# Patient Record
Sex: Male | Born: 1995
Health system: Southern US, Community
[De-identification: ages and names within clinical notes are randomized; demographics above are authoritative.]

## PROBLEM LIST (undated history)

## (undated) DIAGNOSIS — E119 Type 2 diabetes mellitus without complications: Secondary | ICD-10-CM

## (undated) DIAGNOSIS — I10 Essential (primary) hypertension: Secondary | ICD-10-CM

## (undated) DIAGNOSIS — J45909 Unspecified asthma, uncomplicated: Secondary | ICD-10-CM

## (undated) HISTORY — DX: Essential (primary) hypertension: I10

## (undated) HISTORY — PX: ADENOIDECTOMY: SUR15

## (undated) HISTORY — DX: Type 2 diabetes mellitus without complications: E11.9

## (undated) HISTORY — PX: TONSILLECTOMY: SUR1361

---
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
## 2018-09-06 ENCOUNTER — Emergency Department (HOSPITAL_COMMUNITY)
Admission: EM | Admit: 2018-09-06 | Discharge: 2018-09-06 | Disposition: A | Discharge status: Home / Self Care | Payer: No Typology Code available for payment source | Attending: Emergency Medicine | Admitting: Emergency Medicine

## 2018-09-06 ENCOUNTER — Other Ambulatory Visit: Payer: Self-pay

## 2018-09-06 ENCOUNTER — Emergency Department (HOSPITAL_COMMUNITY): Payer: No Typology Code available for payment source

## 2018-09-06 ENCOUNTER — Encounter (HOSPITAL_COMMUNITY): Payer: Self-pay | Admitting: Emergency Medicine

## 2018-09-06 DIAGNOSIS — S20212A Contusion of left front wall of thorax, initial encounter: Secondary | ICD-10-CM | POA: Insufficient documentation

## 2018-09-06 DIAGNOSIS — Y9389 Activity, other specified: Secondary | ICD-10-CM | POA: Diagnosis not present

## 2018-09-06 DIAGNOSIS — Y92481 Parking lot as the place of occurrence of the external cause: Secondary | ICD-10-CM | POA: Insufficient documentation

## 2018-09-06 DIAGNOSIS — Y999 Unspecified external cause status: Secondary | ICD-10-CM | POA: Insufficient documentation

## 2018-09-06 DIAGNOSIS — J45909 Unspecified asthma, uncomplicated: Secondary | ICD-10-CM | POA: Insufficient documentation

## 2018-09-06 HISTORY — DX: Unspecified asthma, uncomplicated: J45.909

## 2018-09-06 MED ORDER — IBUPROFEN 600 MG PO TABS: 600.0000 mg | ORAL_TABLET | Freq: Four times a day (QID) | ORAL | 0 refills | Status: DC | PRN

## 2018-09-06 MED ORDER — CYCLOBENZAPRINE HCL 10 MG PO TABS: 10.0000 mg | ORAL_TABLET | Freq: Two times a day (BID) | ORAL | 0 refills | Status: DC | PRN

## 2018-09-06 NOTE — ED Notes (Signed)
Fast track pt, see Providers assessment  
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
## 2020-07-03 ENCOUNTER — Inpatient Hospital Stay (HOSPITAL_COMMUNITY)
Admission: EM | Admit: 2020-07-03 | Discharge: 2020-07-09 | DRG: 464 | Disposition: A | Discharge status: Home / Self Care | Payer: Self-pay | Attending: Internal Medicine | Admitting: Internal Medicine

## 2020-07-03 ENCOUNTER — Encounter (HOSPITAL_COMMUNITY): Payer: Self-pay | Admitting: *Deleted

## 2020-07-03 DIAGNOSIS — E872 Acidosis: Secondary | ICD-10-CM | POA: Diagnosis present

## 2020-07-03 DIAGNOSIS — B9561 Methicillin susceptible Staphylococcus aureus infection as the cause of diseases classified elsewhere: Secondary | ICD-10-CM | POA: Diagnosis present

## 2020-07-03 DIAGNOSIS — K760 Fatty (change of) liver, not elsewhere classified: Secondary | ICD-10-CM | POA: Diagnosis present

## 2020-07-03 DIAGNOSIS — Z20822 Contact with and (suspected) exposure to covid-19: Secondary | ICD-10-CM | POA: Diagnosis present

## 2020-07-03 DIAGNOSIS — E1165 Type 2 diabetes mellitus with hyperglycemia: Secondary | ICD-10-CM | POA: Diagnosis present

## 2020-07-03 DIAGNOSIS — E871 Hypo-osmolality and hyponatremia: Secondary | ICD-10-CM

## 2020-07-03 DIAGNOSIS — E119 Type 2 diabetes mellitus without complications: Secondary | ICD-10-CM

## 2020-07-03 DIAGNOSIS — M726 Necrotizing fasciitis: Principal | ICD-10-CM | POA: Diagnosis present

## 2020-07-03 DIAGNOSIS — Z6841 Body Mass Index (BMI) 40.0 and over, adult: Secondary | ICD-10-CM

## 2020-07-03 DIAGNOSIS — L0231 Cutaneous abscess of buttock: Secondary | ICD-10-CM | POA: Diagnosis present

## 2020-07-03 LAB — CBC WITH DIFFERENTIAL/PLATELET
Abs Immature Granulocytes: 0.09 10*3/uL — ABNORMAL HIGH (ref 0.00–0.07)
Basophils Absolute: 0 10*3/uL (ref 0.0–0.1)
Basophils Relative: 0 %
Eosinophils Absolute: 0 10*3/uL (ref 0.0–0.5)
Eosinophils Relative: 0 %
HCT: 46 % (ref 39.0–52.0)
Hemoglobin: 14.5 g/dL (ref 13.0–17.0)
Immature Granulocytes: 1 %
Lymphocytes Relative: 11 %
Lymphs Abs: 2 10*3/uL (ref 0.7–4.0)
MCH: 25.3 pg — ABNORMAL LOW (ref 26.0–34.0)
MCHC: 31.5 g/dL (ref 30.0–36.0)
MCV: 80.3 fL (ref 80.0–100.0)
Monocytes Absolute: 1.4 10*3/uL — ABNORMAL HIGH (ref 0.1–1.0)
Monocytes Relative: 8 %
Neutro Abs: 13.8 10*3/uL — ABNORMAL HIGH (ref 1.7–7.7)
Neutrophils Relative %: 80 %
Platelets: 259 10*3/uL (ref 150–400)
RBC: 5.73 MIL/uL (ref 4.22–5.81)
RDW: 13.2 % (ref 11.5–15.5)
WBC: 17.3 10*3/uL — ABNORMAL HIGH (ref 4.0–10.5)
nRBC: 0 % (ref 0.0–0.2)

## 2020-07-03 LAB — COMPREHENSIVE METABOLIC PANEL
ALT: 26 U/L (ref 0–44)
AST: 15 U/L (ref 15–41)
Albumin: 3.1 g/dL — ABNORMAL LOW (ref 3.5–5.0)
Alkaline Phosphatase: 59 U/L (ref 38–126)
Anion gap: 13 (ref 5–15)
BUN: 6 mg/dL (ref 6–20)
CO2: 21 mmol/L — ABNORMAL LOW (ref 22–32)
Calcium: 8.8 mg/dL — ABNORMAL LOW (ref 8.9–10.3)
Chloride: 96 mmol/L — ABNORMAL LOW (ref 98–111)
Creatinine, Ser: 0.91 mg/dL (ref 0.61–1.24)
GFR calc Af Amer: >60 mL/min (ref >60)
GFR calc non Af Amer: >60 mL/min (ref >60)
Glucose, Bld: 313 mg/dL — ABNORMAL HIGH (ref 70–99)
Potassium: 4.1 mmol/L (ref 3.5–5.1)
Sodium: 130 mmol/L — ABNORMAL LOW (ref 135–145)
Total Bilirubin: 0.7 mg/dL (ref 0.3–1.2)
Total Protein: 7.9 g/dL (ref 6.5–8.1)

## 2020-07-03 LAB — PROTIME-INR
INR: 1.1 (ref 0.8–1.2)
Prothrombin Time: 13.7 seconds (ref 11.4–15.2)

## 2020-07-03 LAB — SARS CORONAVIRUS 2 BY RT PCR (HOSPITAL ORDER, PERFORMED IN ~~LOC~~ HOSPITAL LAB): SARS Coronavirus 2: NEGATIVE

## 2020-07-03 LAB — LACTIC ACID, PLASMA: Lactic Acid, Venous: 1.5 mmol/L (ref 0.5–1.9)

## 2020-07-03 MED ORDER — ACETAMINOPHEN 325 MG PO TABS
650.0000 mg | ORAL_TABLET | Freq: Once | ORAL | Status: AC | PRN
  Administered 2020-07-03: 650 mg via ORAL
  Filled 2020-07-03: qty 2

## 2020-07-03 NOTE — ED Triage Notes (Signed)
To ED for eval of possible abscess to right inner buttock area. Started 2 days ago. PA evaluated in triage and appears to be draining. Appears in nad. Painful to sit if not leaning to left side.

## 2020-07-03 NOTE — ED Provider Notes (Addendum)
Patient placed in Quick Look pathway, seen and evaluated   Chief Complaint: Right buttock abscess   HPI:   Abscess to right buttocks x2 days.  States he has not had a bowel movement in 2 days.  Was noted to be febrile with a temperature 102 here in the ED.  States he had his first Covid vaccine, no abdominal pain, cough, chest pain, shortness of breath.  ROS: Redness, swelling   Physical Exam:   Gen: No distress  Neuro: Awake and Alert  Skin: Warm    Focused Exam: Left buttocks access.  Limited exam, however does not appear to be involving the anus.  Patient is overall well-appearing, nontoxic appearing.   Initiation of care has begun. The patient has been counseled on the process, plan, and necessity for staying for the completion/evaluation, and the remainder of the medical screening examination        Leone Brand 07/03/20 1852    Charlynne Pander, MD 07/03/20 [phone]

## 2020-07-04 ENCOUNTER — Inpatient Hospital Stay (HOSPITAL_COMMUNITY): Payer: Self-pay | Admitting: Certified Registered Nurse Anesthetist

## 2020-07-04 ENCOUNTER — Encounter (HOSPITAL_COMMUNITY)
Admission: EM | Disposition: A | Discharge status: Home / Self Care | Payer: Self-pay | Source: Home / Self Care | Attending: Student in an Organized Health Care Education/Training Program

## 2020-07-04 ENCOUNTER — Encounter (HOSPITAL_COMMUNITY): Payer: Self-pay | Admitting: Student in an Organized Health Care Education/Training Program

## 2020-07-04 ENCOUNTER — Emergency Department (HOSPITAL_COMMUNITY): Payer: Self-pay

## 2020-07-04 ENCOUNTER — Other Ambulatory Visit: Payer: Self-pay

## 2020-07-04 DIAGNOSIS — M726 Necrotizing fasciitis: Secondary | ICD-10-CM

## 2020-07-04 DIAGNOSIS — E871 Hypo-osmolality and hyponatremia: Secondary | ICD-10-CM

## 2020-07-04 DIAGNOSIS — E1165 Type 2 diabetes mellitus with hyperglycemia: Secondary | ICD-10-CM

## 2020-07-04 DIAGNOSIS — E119 Type 2 diabetes mellitus without complications: Secondary | ICD-10-CM

## 2020-07-04 HISTORY — PX: INCISION AND DRAINAGE PERIRECTAL ABSCESS: SHX1804

## 2020-07-04 HISTORY — DX: Necrotizing fasciitis: M72.6

## 2020-07-04 LAB — CBG MONITORING, ED: Glucose-Capillary: 276 mg/dL — ABNORMAL HIGH (ref 70–99)

## 2020-07-04 LAB — URINALYSIS, ROUTINE W REFLEX MICROSCOPIC
Bacteria, UA: NONE SEEN
Bilirubin Urine: NEGATIVE
Glucose, UA: >=500 mg/dL — AB
Ketones, ur: 80 mg/dL — AB
Nitrite: NEGATIVE
Protein, ur: 100 mg/dL — AB
Specific Gravity, Urine: 1.04 — ABNORMAL HIGH (ref 1.005–1.030)
pH: 5 (ref 5.0–8.0)

## 2020-07-04 LAB — GLUCOSE, CAPILLARY
Glucose-Capillary: 252 mg/dL — ABNORMAL HIGH (ref 70–99)
Glucose-Capillary: 251 mg/dL — ABNORMAL HIGH (ref 70–99)
Glucose-Capillary: 276 mg/dL — ABNORMAL HIGH (ref 70–99)
Glucose-Capillary: 210 mg/dL — ABNORMAL HIGH (ref 70–99)

## 2020-07-04 LAB — HEMOGLOBIN A1C
Hgb A1c MFr Bld: 13.1 % — ABNORMAL HIGH (ref 4.8–5.6)
Mean Plasma Glucose: 329.27 mg/dL

## 2020-07-04 LAB — HIV ANTIBODY (ROUTINE TESTING W REFLEX): HIV Screen 4th Generation wRfx: NONREACTIVE

## 2020-07-04 SURGERY — INCISION AND DRAINAGE, ABSCESS, PERIRECTAL
Anesthesia: General | Site: Perineum

## 2020-07-04 MED ORDER — DEXAMETHASONE SODIUM PHOSPHATE 10 MG/ML IJ SOLN
INTRAMUSCULAR | Status: AC
  Filled 2020-07-04: qty 1

## 2020-07-04 MED ORDER — ROCURONIUM BROMIDE 10 MG/ML (PF) SYRINGE
PREFILLED_SYRINGE | INTRAVENOUS | Status: DC | PRN
  Administered 2020-07-04: 50 mg via INTRAVENOUS

## 2020-07-04 MED ORDER — FENTANYL CITRATE (PF) 250 MCG/5ML IJ SOLN
INTRAMUSCULAR | Status: DC | PRN
  Administered 2020-07-04 (×2): 50 ug via INTRAVENOUS
  Administered 2020-07-04: 100 ug via INTRAVENOUS

## 2020-07-04 MED ORDER — SUCCINYLCHOLINE CHLORIDE 200 MG/10ML IV SOSY
PREFILLED_SYRINGE | INTRAVENOUS | Status: DC | PRN
  Administered 2020-07-04: 160 mg via INTRAVENOUS

## 2020-07-04 MED ORDER — ROCURONIUM BROMIDE 10 MG/ML (PF) SYRINGE
PREFILLED_SYRINGE | INTRAVENOUS | Status: AC
  Filled 2020-07-04: qty 20

## 2020-07-04 MED ORDER — CHLORHEXIDINE GLUCONATE CLOTH 2 % EX PADS
6.0000 | MEDICATED_PAD | Freq: Every day | CUTANEOUS | Status: DC
  Administered 2020-07-04 – 2020-07-07 (×4): 6 via TOPICAL

## 2020-07-04 MED ORDER — SODIUM CHLORIDE 0.9 % IV BOLUS
1000.0000 mL | Freq: Once | INTRAVENOUS | Status: AC
  Administered 2020-07-04: 1000 mL via INTRAVENOUS

## 2020-07-04 MED ORDER — MIDAZOLAM HCL 2 MG/2ML IJ SOLN
INTRAMUSCULAR | Status: DC | PRN
  Administered 2020-07-04: 2 mg via INTRAVENOUS

## 2020-07-04 MED ORDER — ONDANSETRON HCL 4 MG/2ML IJ SOLN
INTRAMUSCULAR | Status: DC | PRN
  Administered 2020-07-04: 4 mg via INTRAVENOUS

## 2020-07-04 MED ORDER — FENTANYL CITRATE (PF) 250 MCG/5ML IJ SOLN
INTRAMUSCULAR | Status: AC
  Filled 2020-07-04: qty 5

## 2020-07-04 MED ORDER — SUGAMMADEX SODIUM 200 MG/2ML IV SOLN
INTRAVENOUS | Status: DC | PRN
  Administered 2020-07-04: 320 mg via INTRAVENOUS

## 2020-07-04 MED ORDER — SUCCINYLCHOLINE CHLORIDE 200 MG/10ML IV SOSY
PREFILLED_SYRINGE | INTRAVENOUS | Status: AC
  Filled 2020-07-04: qty 10

## 2020-07-04 MED ORDER — POLYETHYLENE GLYCOL 3350 17 G PO PACK: 17.0000 g | PACK | Freq: Every day | ORAL | Status: DC | PRN

## 2020-07-04 MED ORDER — METRONIDAZOLE IN NACL 5-0.79 MG/ML-% IV SOLN
500.0000 mg | Freq: Once | INTRAVENOUS | Status: AC
  Administered 2020-07-04: 500 mg via INTRAVENOUS
  Filled 2020-07-04: qty 100

## 2020-07-04 MED ORDER — SODIUM CHLORIDE 0.9 % IV SOLN
2.0000 g | Freq: Three times a day (TID) | INTRAVENOUS | Status: DC
  Administered 2020-07-04 – 2020-07-08 (×12): 2 g via INTRAVENOUS
  Filled 2020-07-04 (×14): qty 2

## 2020-07-04 MED ORDER — LIDOCAINE 2% (20 MG/ML) 5 ML SYRINGE
INTRAMUSCULAR | Status: DC | PRN
  Administered 2020-07-04: 100 mg via INTRAVENOUS

## 2020-07-04 MED ORDER — HEPARIN SODIUM (PORCINE) 5000 UNIT/ML IJ SOLN
5000.0000 [IU] | Freq: Three times a day (TID) | INTRAMUSCULAR | Status: DC
  Administered 2020-07-05 – 2020-07-07 (×7): 5000 [IU] via SUBCUTANEOUS
  Filled 2020-07-04 (×7): qty 1

## 2020-07-04 MED ORDER — VANCOMYCIN HCL 2000 MG/400ML IV SOLN
2000.0000 mg | Freq: Once | INTRAVENOUS | Status: AC
  Administered 2020-07-04: 2000 mg via INTRAVENOUS
  Filled 2020-07-04: qty 400

## 2020-07-04 MED ORDER — VANCOMYCIN HCL 1250 MG/250ML IV SOLN
1250.0000 mg | Freq: Three times a day (TID) | INTRAVENOUS | Status: DC
  Administered 2020-07-04 – 2020-07-07 (×9): 1250 mg via INTRAVENOUS
  Filled 2020-07-04 (×10): qty 250

## 2020-07-04 MED ORDER — LACTATED RINGERS IV SOLN: INTRAVENOUS | Status: DC

## 2020-07-04 MED ORDER — MENTHOL 3 MG MT LOZG
1.0000 | LOZENGE | OROMUCOSAL | Status: DC | PRN
  Administered 2020-07-04 – 2020-07-06 (×2): 3 mg via ORAL
  Filled 2020-07-04: qty 9

## 2020-07-04 MED ORDER — ROCURONIUM BROMIDE 10 MG/ML (PF) SYRINGE
PREFILLED_SYRINGE | INTRAVENOUS | Status: AC
  Filled 2020-07-04: qty 10

## 2020-07-04 MED ORDER — 0.9 % SODIUM CHLORIDE (POUR BTL) OPTIME
TOPICAL | Status: DC | PRN
  Administered 2020-07-04: 1000 mL

## 2020-07-04 MED ORDER — HEMOSTATIC AGENTS (NO CHARGE) OPTIME
TOPICAL | Status: DC | PRN
  Administered 2020-07-04: 1 via TOPICAL

## 2020-07-04 MED ORDER — PHENYLEPHRINE 40 MCG/ML (10ML) SYRINGE FOR IV PUSH (FOR BLOOD PRESSURE SUPPORT)
PREFILLED_SYRINGE | INTRAVENOUS | Status: AC
  Filled 2020-07-04: qty 10

## 2020-07-04 MED ORDER — ONDANSETRON HCL 4 MG/2ML IJ SOLN: INTRAMUSCULAR | Status: DC | PRN

## 2020-07-04 MED ORDER — PHENYLEPHRINE 40 MCG/ML (10ML) SYRINGE FOR IV PUSH (FOR BLOOD PRESSURE SUPPORT)
PREFILLED_SYRINGE | INTRAVENOUS | Status: DC | PRN
  Administered 2020-07-04: 40 ug via INTRAVENOUS

## 2020-07-04 MED ORDER — ONDANSETRON HCL 4 MG/2ML IJ SOLN
INTRAMUSCULAR | Status: AC
  Filled 2020-07-04: qty 2

## 2020-07-04 MED ORDER — CLINDAMYCIN PHOSPHATE 600 MG/50ML IV SOLN
600.0000 mg | Freq: Once | INTRAVENOUS | Status: AC
  Administered 2020-07-04: 600 mg via INTRAVENOUS
  Filled 2020-07-04: qty 50

## 2020-07-04 MED ORDER — PROPOFOL 10 MG/ML IV BOLUS
INTRAVENOUS | Status: DC | PRN
  Administered 2020-07-04: 200 mg via INTRAVENOUS

## 2020-07-04 MED ORDER — BUPIVACAINE HCL (PF) 0.25 % IJ SOLN
INTRAMUSCULAR | Status: AC
  Filled 2020-07-04: qty 30

## 2020-07-04 MED ORDER — CHLORHEXIDINE GLUCONATE 0.12 % MT SOLN
OROMUCOSAL | Status: AC
  Administered 2020-07-04: 15 mL
  Filled 2020-07-04: qty 15

## 2020-07-04 MED ORDER — BISACODYL 5 MG PO TBEC: 5.0000 mg | DELAYED_RELEASE_TABLET | Freq: Every day | ORAL | Status: DC | PRN

## 2020-07-04 MED ORDER — EPHEDRINE 5 MG/ML INJ
INTRAVENOUS | Status: AC
  Filled 2020-07-04: qty 10

## 2020-07-04 MED ORDER — MORPHINE SULFATE (PF) 4 MG/ML IV SOLN
4.0000 mg | Freq: Once | INTRAVENOUS | Status: AC
  Administered 2020-07-04: 4 mg via INTRAVENOUS
  Filled 2020-07-04: qty 1

## 2020-07-04 MED ORDER — LIDOCAINE 2% (20 MG/ML) 5 ML SYRINGE
INTRAMUSCULAR | Status: AC
  Filled 2020-07-04: qty 10

## 2020-07-04 MED ORDER — LIDOCAINE 2% (20 MG/ML) 5 ML SYRINGE
INTRAMUSCULAR | Status: AC
  Filled 2020-07-04: qty 5

## 2020-07-04 MED ORDER — PROPOFOL 10 MG/ML IV BOLUS
INTRAVENOUS | Status: AC
  Filled 2020-07-04: qty 20

## 2020-07-04 MED ORDER — INSULIN GLARGINE 100 UNIT/ML ~~LOC~~ SOLN
20.0000 [IU] | Freq: Every day | SUBCUTANEOUS | Status: DC
  Administered 2020-07-04: 20 [IU] via SUBCUTANEOUS
  Filled 2020-07-04 (×2): qty 0.2

## 2020-07-04 MED ORDER — HYDROMORPHONE HCL 1 MG/ML IJ SOLN
0.5000 mg | INTRAMUSCULAR | Status: DC | PRN
  Administered 2020-07-06: 0.5 mg via INTRAVENOUS
  Administered 2020-07-07: 1 mg via INTRAVENOUS
  Filled 2020-07-04 (×2): qty 1

## 2020-07-04 MED ORDER — IOHEXOL 300 MG/ML  SOLN
100.0000 mL | Freq: Once | INTRAMUSCULAR | Status: AC | PRN
  Administered 2020-07-04: 100 mL via INTRAVENOUS

## 2020-07-04 MED ORDER — ACETAMINOPHEN 325 MG PO TABS
650.0000 mg | ORAL_TABLET | ORAL | Status: AC | PRN
  Administered 2020-07-05: 650 mg via ORAL
  Filled 2020-07-04: qty 2

## 2020-07-04 MED ORDER — INSULIN ASPART 100 UNIT/ML ~~LOC~~ SOLN
0.0000 [IU] | SUBCUTANEOUS | Status: DC
  Administered 2020-07-04: 8 [IU] via SUBCUTANEOUS
  Administered 2020-07-04: 5 [IU] via SUBCUTANEOUS
  Administered 2020-07-04: 8 [IU] via SUBCUTANEOUS
  Administered 2020-07-05 (×4): 5 [IU] via SUBCUTANEOUS
  Administered 2020-07-05 – 2020-07-06 (×6): 3 [IU] via SUBCUTANEOUS

## 2020-07-04 MED ORDER — MIDAZOLAM HCL 2 MG/2ML IJ SOLN
INTRAMUSCULAR | Status: AC
  Filled 2020-07-04: qty 2

## 2020-07-04 MED ORDER — CLINDAMYCIN PHOSPHATE 600 MG/50ML IV SOLN
600.0000 mg | Freq: Three times a day (TID) | INTRAVENOUS | Status: DC
  Administered 2020-07-04 – 2020-07-06 (×5): 600 mg via INTRAVENOUS
  Filled 2020-07-04 (×5): qty 50

## 2020-07-04 MED ORDER — PHENYLEPHRINE 40 MCG/ML (10ML) SYRINGE FOR IV PUSH (FOR BLOOD PRESSURE SUPPORT)
PREFILLED_SYRINGE | INTRAVENOUS | Status: AC
  Filled 2020-07-04: qty 20

## 2020-07-04 MED ORDER — SODIUM CHLORIDE 0.9 % IV SOLN
2.0000 g | Freq: Once | INTRAVENOUS | Status: AC
  Administered 2020-07-04: 2 g via INTRAVENOUS
  Filled 2020-07-04: qty 2

## 2020-07-04 SURGICAL SUPPLY — 38 items
BRIEF STRETCH FOR OB PAD LRG (UNDERPADS AND DIAPERS) ×2 IMPLANT
CANISTER SUCT 3000ML PPV (MISCELLANEOUS) ×2 IMPLANT
CATH FOLEY 2WAY SLVR  5CC 12FR (CATHETERS) ×1
CATH FOLEY 2WAY SLVR 5CC 12FR (CATHETERS) ×1 IMPLANT
COVER SURGICAL LIGHT HANDLE (MISCELLANEOUS) ×2 IMPLANT
COVER WAND RF STERILE (DRAPES) ×2 IMPLANT
DRESSING QUICKCLOT CNTRL ZFOLD (GAUZE/BANDAGES/DRESSINGS) ×1 IMPLANT
DRSG PAD ABDOMINAL 8X10 ST (GAUZE/BANDAGES/DRESSINGS) ×2 IMPLANT
DRSG QUICKCLOT CNTRL ZFOLD (GAUZE/BANDAGES/DRESSINGS) ×2
ELECT CAUTERY BLADE 6.4 (BLADE) ×2 IMPLANT
ELECT REM PT RETURN 9FT ADLT (ELECTROSURGICAL)
ELECTRODE REM PT RTRN 9FT ADLT (ELECTROSURGICAL) IMPLANT
GAUZE PACKING IODOFORM 1X5 (PACKING) IMPLANT
GAUZE SPONGE 4X4 12PLY STRL (GAUZE/BANDAGES/DRESSINGS) ×2 IMPLANT
GLOVE BIO SURGEON STRL SZ7 (GLOVE) ×2 IMPLANT
GLOVE BIOGEL PI IND STRL 7.5 (GLOVE) ×1 IMPLANT
GLOVE BIOGEL PI INDICATOR 7.5 (GLOVE) ×1
GOWN STRL REUS W/ TWL LRG LVL3 (GOWN DISPOSABLE) ×2 IMPLANT
GOWN STRL REUS W/TWL LRG LVL3 (GOWN DISPOSABLE) ×2
KIT BASIN OR (CUSTOM PROCEDURE TRAY) ×2 IMPLANT
KIT TURNOVER KIT B (KITS) ×2 IMPLANT
NEEDLE HYPO 25GX1X1/2 BEV (NEEDLE) ×2 IMPLANT
NS IRRIG 1000ML POUR BTL (IV SOLUTION) ×2 IMPLANT
PACK GENERAL/GYN (CUSTOM PROCEDURE TRAY) IMPLANT
PACK LITHOTOMY IV (CUSTOM PROCEDURE TRAY) ×2 IMPLANT
PAD ABD 8X10 STRL (GAUZE/BANDAGES/DRESSINGS) ×2 IMPLANT
PAD ARMBOARD 7.5X6 YLW CONV (MISCELLANEOUS) ×2 IMPLANT
PENCIL SMOKE EVACUATOR (MISCELLANEOUS) ×2 IMPLANT
SHEET MEDIUM DRAPE 40X70 STRL (DRAPES) ×2 IMPLANT
STAPLER VISISTAT 35W (STAPLE) ×2 IMPLANT
SURGILUBE 2OZ TUBE FLIPTOP (MISCELLANEOUS) IMPLANT
SWAB COLLECTION DEVICE MRSA (MISCELLANEOUS) IMPLANT
SWAB CULTURE ESWAB REG 1ML (MISCELLANEOUS) IMPLANT
SYR BULB EAR ULCER 3OZ GRN STR (SYRINGE) ×2 IMPLANT
SYR CONTROL 10ML LL (SYRINGE) ×2 IMPLANT
TOWEL GREEN STERILE (TOWEL DISPOSABLE) ×2 IMPLANT
TOWEL GREEN STERILE FF (TOWEL DISPOSABLE) ×2 IMPLANT
UNDERPAD 30X36 HEAVY ABSORB (UNDERPADS AND DIAPERS) ×2 IMPLANT

## 2020-07-04 NOTE — ED Notes (Signed)
Consent signed.

## 2020-07-04 NOTE — Transfer of Care (Signed)
Immediate Anesthesia Transfer of Care Note  Patient: Scott Logan  Procedure(s) Performed: IRRIGATION AND DEBRIDEMENT PERINEAL ABSCESS (N/A Perineum)  Patient Location: PACU  Anesthesia Type:General  Level of Consciousness: awake and alert   Airway & Oxygen Therapy: Patient Spontanous Breathing and Patient connected to face mask oxygen  Post-op Assessment: Report given to RN and Post -op Vital signs reviewed and stable  Post vital signs: Reviewed and stable  Last Vitals:  Vitals Value Taken Time  BP 127/76 07/04/20 1245  Temp 37.4 C 07/04/20 1245  Pulse 126 07/04/20 1247  Resp 22 07/04/20 1247  SpO2 96 % 07/04/20 1247  Vitals shown include unvalidated device data.  Last Pain:  Vitals:   07/04/20 0647  TempSrc:   PainSc: 0-No pain         Complications: No complications documented.

## 2020-07-04 NOTE — ED Notes (Signed)
Patient off the floor to CT.

## 2020-07-04 NOTE — Anesthesia Preprocedure Evaluation (Addendum)
Anesthesia Evaluation  Patient identified by MRN, date of birth, ID band Patient awake    Reviewed: Allergy & Precautions, NPO status , Patient's Chart, lab work & pertinent test results  Airway Mallampati: III  TM Distance: >3 FB Neck ROM: Full    Dental  (+) Dental Advisory Given, Teeth Intact   Pulmonary asthma ,    Pulmonary exam normal breath sounds clear to auscultation       Cardiovascular negative cardio ROS Normal cardiovascular exam Rhythm:Regular Rate:Normal     Neuro/Psych negative neurological ROS     GI/Hepatic negative GI ROS, Neg liver ROS,   Endo/Other  diabetes, Poorly ControlledMorbid obesity  Renal/GU negative Renal ROS     Musculoskeletal negative musculoskeletal ROS (+)   Abdominal (+) + obese,   Peds  Hematology negative hematology ROS (+)   Anesthesia Other Findings   Reproductive/Obstetrics                            Anesthesia Physical Anesthesia Plan  ASA: III and emergent  Anesthesia Plan: General   Post-op Pain Management:    Induction: Intravenous, Rapid sequence and Cricoid pressure planned  PONV Risk Score and Plan: 3 and Ondansetron, Treatment may vary due to age or medical condition and Midazolam  Airway Management Planned: Oral ETT  Additional Equipment: None  Intra-op Plan:   Post-operative Plan: Extubation in OR  Informed Consent: I have reviewed the patients History and Physical, chart, labs and discussed the procedure including the risks, benefits and alternatives for the proposed anesthesia with the patient or authorized representative who has indicated his/her understanding and acceptance.     Dental advisory given  Plan Discussed with: CRNA  Anesthesia Plan Comments:         Anesthesia Quick Evaluation

## 2020-07-04 NOTE — H&P (Addendum)
Date: 07/04/2020               Patient Name:  Scott Logan MRN: [id number]  DOB: 1996-01-13 Age / Sex: 24 y.o., male   PCP: System, Pcp Not In         Medical Service: Internal Medicine Teaching Service         Attending Physician: Dr. Oswaldo Done     First Contact: Dr. Renaldo Fiddler Pager: [phone]  Second Contact: Nedra Hai, MD, Ivin Booty Pager: Eustaquio Maize [phone])       After Hours (After 5p/  First Contact Pager: [phone]  weekends / holidays): Second Contact Pager: [phone]   Chief Complaint: Left buttock pain  History of Present Illness: Scott Logan is a 24 year old gentleman with no pertinent medical history presenting with a 2-day history of left buttock pain, fever, chills and purulent drainage.  He states that about 2 days ago he noticed a "bump" on the left side of his buttock however did not present to the emergency department until he began having subjective fevers.  While in the emergency department, he noticed drainage.  Otherwise, he denies any prior history of such occurrence and currently denies chest pain, abdominal pain, nausea, vomiting, dizziness, lightheadedness, headaches, lower extremity edema.  He was noted to be febrile with T-max of 102.6 degrees Fahrenheit in the ED, leukocytosis, tachycardic, tachypneic and hyperglycemic.   Lab Orders     SARS Coronavirus 2 by RT PCR (hospital order, performed in [facility] hospital lab) Nasopharyngeal Nasopharyngeal Swab     Blood culture (routine x 2)     Wound or Superficial Culture     Comprehensive metabolic panel     CBC with Differential     Protime-INR     Urinalysis, Routine w reflex microscopic     Hemoglobin A1c     HIV Antibody (routine testing w rflx)     Basic metabolic panel     CBC   Meds:  *None   Allergies: Allergies as of 07/03/2020  . (No Known Allergies)   Past Medical History:  Diagnosis Date  . 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  Denies alcohol, cigarette or illicit drug use.  Review of Systems: A complete ROS was negative except as per HPI.   Physical Exam: Blood pressure 133/83, pulse (!) 122, temperature 98.8 F (37.1 C), temperature source Oral, resp. rate (!) 23, height 5\' 7"  (1.702 m), weight 136.1 kg, SpO2 100 %. Physical Exam Vitals and nursing note reviewed.  Constitutional:      General: He is not in acute distress.    Appearance: He is obese. He is not toxic-appearing or diaphoretic.  HENT:     Head: Normocephalic.  Eyes:     General: No scleral icterus.    Conjunctiva/sclera: Conjunctivae normal.  Cardiovascular:     Rate and Rhythm: Tachycardia present.     Heart sounds: No murmur heard.  No gallop.   Pulmonary:     Effort: Pulmonary effort is normal.     Breath sounds: Normal breath sounds. No wheezing or rales.  Abdominal:     General: Bowel sounds are normal. Distension: Due to body habitus   Genitourinary:   Musculoskeletal:     Cervical back: Neck supple.     Right lower leg: No edema.     Left lower leg: No edema.  Skin:  Findings: Erythema and lesion present.  Psychiatric:        Mood and Affect: Mood normal.        Behavior: Behavior normal.         EKG: personally reviewed my interpretation is sinus tachycardia with early repole  CT abdomen and pelvis 1. Extensive gas and inflammatory changes in the perineum, left of midline compatible with necrotizing fasciitis. 2. Gas and inflammatory changes extend into the base of the scrotum. 3. No intraperitoneal inflammation or DVT is present. 4. Hepatic steatosis. 5. Layering high density fluid in the gallbladder likely represents sludge. No acute cholecystitis is present.  Assessment & Plan by Problem: Principal Problem:   Necrotizing fasciitis of left buttock (HCC) Active Problems:   Hyperglycemia   Pseudo-Hyponatremia   #Necrotizing fasciitis of the left  buttock Confirmed on CT abdomen and pelvis showing extensive gas inflammatory changes in the perineum, gas and inflammatory changes extending into the base of the scrotum.  So far he has received 1 dose of cefepime, clindamycin, Flagyl and vancomycin.  He has also been evaluated by general surgery who plans to operate today -Appreciate general surgery recommendations -Continue cefepime, vancomycin and clindamycin -Follow fever curve, daily CBC -Pain regimen: Dilaudid 0.5-1 mg every 4 hours, Tylenol 650 mg -Follow-up wound culture and tissue cultures after surgery   #Hyperglycemia Found to be hyperglycemic with glucose of 313 on CMP -Follow up hemoglobin A1c -SSI every 4 hours   #Mild non-anion gap metabolic acidosis Anion gap of 13, serum bicarb of 21 -Status post IVF with 2 L of normal saline -Continue LR at 100 mL/hour   #Pseudohyponatremia Corrected sodium of 133 -Continue LR 100 mL/hour   #Hepatic steatosis Likely secondary to metabolic associated liver disease    FEN: N.p.o. VTE ppx: Subcutaneous heparin CODE STATUS: Full code  Prior to Admission Living Arrangement: Home Anticipated Discharge Location: Home Barriers to Discharge: Treatment of necrotizing fasciitis  Dispo: Admit patient to Inpatient with expected length of stay greater than 2 midnights.  Signed: Yvette Rack, MD 07/04/2020, 9:44 AM  Pager: [phone] Internal Medicine Teaching Service After 5pm on weekdays and 1pm on weekends: On Call pager: [phone]

## 2020-07-04 NOTE — ED Provider Notes (Signed)
[facility] EMERGENCY DEPARTMENT Provider Note   CSN: [id number] Arrival date & time: 07/03/20  1717    History Chief Complaint  Patient presents with  . Abscess   Scott Logan is a 24 y.o. male with past medical history significant for asthma who presents for evaluation of abscess. Left buttock abscess x 2 days. Has not taken anything for pain. Rates pain a 7/10.  Has been running fevers at home however is not been taking his temperature.  Is been taking Aleve without relief.  Had his first Covid vaccine, he is due for a second.  No known Covid exposures.  Note headache, lightness, dizziness, chest pain, shortness of breath, dysuria.  Did notice dark urine earlier today.  No pain with bowel movements.  Last bowel movement approximately 12 hours ago without melena or bright red blood per rectum. No prior history of perirectal abscesses.  No history of diabetes or immunocompromise state. Denies additional aggravating or alleviating factors.  History obtained from patient and past medical records. No interpretor was used.  HPI     Past Medical History:  Diagnosis Date  . Asthma     There are no problems to display for this patient.   Past Surgical History:  Procedure Laterality Date  . TONSILLECTOMY         No family history on file.  Social History   Tobacco Use  . Smoking status: Never Smoker  . Smokeless tobacco: Never Used  Substance Use Topics  . Alcohol use: Never  . Drug use: Never    Home Medications Prior to Admission medications   Medication Sig Start Date End Date Taking? Authorizing Provider  cyclobenzaprine (FLEXERIL) 10 MG tablet Take 1 tablet (10 mg total) by mouth 2 (two) times daily as needed for muscle spasms. 09/06/18   McDonald, Mia A, PA-C  ibuprofen (ADVIL,MOTRIN) 600 MG tablet Take 1 tablet (600 mg total) by mouth every 6 (six) hours as needed. 09/06/18   McDonald, Mia A, PA-C    Allergies    Patient has no known  allergies.  Review of Systems   Review of Systems  Constitutional: Positive for fever.  HENT: Negative.   Respiratory: Negative.   Cardiovascular: Negative.   Gastrointestinal: Negative.        Rectal abscess  Endocrine: Negative for polydipsia and polyuria.  Genitourinary: Negative.   Musculoskeletal: Negative.   Skin: Negative.   Neurological: Negative.   All other systems reviewed and are negative.   Physical Exam Updated Vital Signs BP 112/88 (BP Location: Left Arm)   Pulse (!) 123   Temp 98.8 F (37.1 C) (Oral)   Resp 18   Ht 5\' 7"  (1.702 m)   Wt 136.1 kg   SpO2 99%   BMI 46.99 kg/m   Physical Exam Vitals and nursing note reviewed. Exam conducted with a chaperone present.  Constitutional:      General: He is not in acute distress.    Appearance: He is well-developed. He is not ill-appearing, toxic-appearing or diaphoretic.  HENT:     Head: Normocephalic and atraumatic.     Nose: Nose normal.     Mouth/Throat:     Mouth: Mucous membranes are moist.  Eyes:     Pupils: Pupils are equal, round, and reactive to light.  Cardiovascular:     Rate and Rhythm: Regular rhythm. Tachycardia present.     Pulses: Normal pulses.     Heart sounds: Normal heart sounds.  Pulmonary:  Effort: Pulmonary effort is normal. No respiratory distress.  Abdominal:     General: Bowel sounds are normal. There is no distension.     Palpations: Abdomen is soft. There is no mass.     Tenderness: There is no abdominal tenderness. There is no right CVA tenderness, left CVA tenderness, guarding or rebound.  Genitourinary:      Comments: Tenderness to posterior testicles.  No testicular fluctuance, induration or erythema.  Moderate induration with fluctuance with active drainage to left gluteal cleft with surrounding erythema warmth.  Active purulent drainage to abscess. Unable to tolerate rectal exam secondary to pain. Musculoskeletal:        General: No swelling, tenderness or  deformity. Normal range of motion.     Cervical back: Normal range of motion and neck supple.     Right lower leg: No edema.     Left lower leg: No edema.  Skin:    General: Skin is warm and dry.     Capillary Refill: Capillary refill takes less than 2 seconds.     Comments: Abscess with active drainage to left gluteal cleft with surrounding erythema warmth.  Neurological:     General: No focal deficit present.     Mental Status: He is alert and oriented to person, place, and time.     ED Results / Procedures / Treatments   Labs (all labs ordered are listed, but only abnormal results are displayed) Labs Reviewed  COMPREHENSIVE METABOLIC PANEL - Abnormal; Notable for the following components:      Result Value   Sodium 130 (*)    Chloride 96 (*)    CO2 21 (*)    Glucose, Bld 313 (*)    Calcium 8.8 (*)    Albumin 3.1 (*)    All other components within normal limits  CBC WITH DIFFERENTIAL/PLATELET - Abnormal; Notable for the following components:   WBC 17.3 (*)    MCH 25.3 (*)    Neutro Abs 13.8 (*)    Monocytes Absolute 1.4 (*)    Abs Immature Granulocytes 0.09 (*)    All other components within normal limits  SARS CORONAVIRUS 2 BY RT PCR (HOSPITAL ORDER, PERFORMED IN [facility])  CULTURE, BLOOD (ROUTINE X 2)  CULTURE, BLOOD (ROUTINE X 2)  LACTIC ACID, PLASMA  PROTIME-INR  LACTIC ACID, PLASMA  URINALYSIS, ROUTINE W REFLEX MICROSCOPIC    EKG None  Radiology No results found.  Procedures Procedures (including critical care time)  Medications Ordered in ED Medications  sodium chloride 0.9 % bolus 1,000 mL (has no administration in time range)  acetaminophen (TYLENOL) tablet 650 mg (650 mg Oral Given 07/03/20 1852)  clindamycin (CLEOCIN) IVPB 600 mg (0 mg Intravenous Stopped 07/04/20 0603)  sodium chloride 0.9 % bolus 1,000 mL (1,000 mLs Intravenous New Bag/Given 07/04/20 0511)  morphine 4 MG/ML injection 4 mg (4 mg Intravenous Given 07/04/20 0511)   iohexol (OMNIPAQUE) 300 MG/ML solution 100 mL (100 mLs Intravenous Contrast Given 07/04/20 0612)   ED Course  I have reviewed the triage vital signs and the nursing notes.  Pertinent labs & imaging results that were available during my care of the patient were reviewed by me and considered in my medical decision making (see chart for details).  24 year old presents for evaluation of buttocks abscess x2 days. He is afebrile here and tachycardic. Unfortunately had extended wait of greater than 11 hours in the waiting room. Patient with abscess to left gluteal region with surrounding  cellulitis. Question deep space involvement. Will obtain imaging.   Labs were obtained from triage which I personally reviewed and interpreted:  CBC leukocytosis at 17.3 Metabolic panel with hyponatremia however hyperglycemia to 313, CO2 21.  Patient denies history of hyperglycemia or diabetes Covid negative Lactic acid 1.5 Blood cultures pending  Patient started on IV fluids, pain control, IV antibiotics. Patient with moderate active drainage to left abscess. He does have a test to redness to posterior testicles. Will obtain CT AP to assess for deep space infection, gas-forming organism given new onset diabetes.  Patient reassessed.  Persistently tachycardic to 120's despite 1 L of fluids.  He denies any chest pain, shortness of breath. Defervesced with prior Tylenol earlier in the night. No recent surgery, immobilization, unilateral leg swelling, redness or warmth.  Pain currently controlled. Will order additional IVF. Doe snot appear to be in DKA at this time, CO2 21, anion gap 13.  Care transferred to [facility], PA-C who will follow up on imaging and determine disposition.     MDM Rules/Calculators/A&P                           Final Clinical Impression(s) / ED Diagnoses Final diagnoses:  Abscess of buttock  Diabetes mellitus, new onset [facility])    Rx / DC Orders ED Discharge Orders    None        Ridhima Golberg A, PA-C 07/04/20 1655    Gilda Crease, MD 07/04/20 [phone]

## 2020-07-04 NOTE — Anesthesia Procedure Notes (Signed)
Procedure Name: Intubation Date/Time: 07/04/2020 11:39 AM Performed by: Bryson Corona, CRNA Pre-anesthesia Checklist: Patient identified, Emergency Drugs available, Suction available and Patient being monitored Patient Re-evaluated:Patient Re-evaluated prior to induction Oxygen Delivery Method: Circle System Utilized Preoxygenation: Pre-oxygenation with 100% oxygen Induction Type: IV induction, Rapid sequence and Cricoid Pressure applied Laryngoscope Size: Mac and 4 Grade View: Grade I Tube type: Oral Tube size: 7.5 mm Number of attempts: 1 Airway Equipment and Method: Stylet Placement Confirmation: ETT inserted through vocal cords under direct vision,  positive ETCO2 and breath sounds checked- equal and bilateral Secured at: 22 cm Tube secured with: Tape Dental Injury: Teeth and Oropharynx as per pre-operative assessment

## 2020-07-04 NOTE — Progress Notes (Signed)
   07/04/20 1500  Assess: MEWS Score  Temp 98.3 F (36.8 C)  BP 126/78  Pulse Rate (!) 117  Resp 20  Level of Consciousness Alert  SpO2 94 %  O2 Device Room Air  Assess: MEWS Score  MEWS Temp 0  MEWS Systolic 0  MEWS Pulse 2  MEWS RR 0  MEWS LOC 0  MEWS Score 2  MEWS Score Color Yellow  Assess: if the MEWS score is Yellow or Red  Were vital signs taken at a resting state? Yes  Focused Assessment No change from prior assessment  Early Detection of Sepsis Score *See Row Information* Medium  MEWS guidelines implemented *See Row Information* Yes  Treat  Pain Scale 0-10  Pain Score 2  Pain Type Acute pain  Pain Location Throat  Pain Orientation Anterior  Pain Descriptors / Indicators Aching  Pain Frequency Constant  Pain Onset Progressive  Patients Stated Pain Goal 0  Pain Intervention(s) MD notified (Comment) (asked for throat lozenges)  Complains of Coughing  Interventions Patient refused interventions  Take Vital Signs  Increase Vital Sign Frequency  Yellow: Q 2hr X 2 then Q 4hr X 2, if remains yellow, continue Q 4hrs  Escalate  MEWS: Escalate Yellow: discuss with charge nurse/RN and consider discussing with provider and RRT  Notify: Charge Nurse/RN  Name of Charge Nurse/RN Notified Alona Bene RN  Date Charge Nurse/RN Notified 07/04/20  Time Charge Nurse/RN Notified 1717  Md aware of patient tachycardia.  Cardiac telemetry initiated per order.  Will continue to monitor.

## 2020-07-04 NOTE — H&P (Signed)
Scott Logan is an 24 y.o. male.   Chief Complaint: infection HPI: 6 yom who has pmh of rad presents with 48 hours of perineal pain and some drainage. He has had first covid vaccine and is due for second pfizer on Saturday.  He has subjective fever. Urinating fine, having bms. No prior history. He underwent evaluation with elevated wbc and ct that shows nsti.  Also has newly diagnosed dm2.    Past Medical History:  Diagnosis Date  . Asthma     Past Surgical History:  Procedure Laterality Date  . TONSILLECTOMY      No family history on file. Social History:  reports that he has never smoked. He has never used smokeless tobacco. He reports that he does not drink alcohol and does not use drugs.  Allergies: No Known Allergies  meds alleve  Results for orders placed or performed during the hospital encounter of 07/03/20 (from the past 48 hour(s))  SARS Coronavirus 2 by RT PCR (hospital order, performed in [facility] hospital lab) Nasopharyngeal Nasopharyngeal Swab     Status: None   Collection Time: 07/03/20  6:50 PM   Specimen: Nasopharyngeal Swab  Result Value Ref Range   SARS Coronavirus 2 NEGATIVE NEGATIVE    Comment: (NOTE) SARS-CoV-2 target nucleic acids are NOT DETECTED.  The SARS-CoV-2 RNA is generally detectable in upper and lower respiratory specimens during the acute phase of infection. The lowest concentration of SARS-CoV-2 viral copies this assay can detect is 250 copies / mL. A negative result does not preclude SARS-CoV-2 infection and should not be used as the sole basis for treatment or other patient management decisions.  A negative result may occur with improper specimen collection / handling, submission of specimen other than nasopharyngeal swab, presence of viral mutation(s) within the areas targeted by this assay, and inadequate number of viral copies (<250 copies / mL). A negative result must be combined with clinical observations, patient history, and  epidemiological information.  Fact Sheet for Patients:   BoilerBrush.com.cy  Fact Sheet for Healthcare Providers: https://pope.com/  This test is not yet approved or  cleared by the [place] FDA and has been authorized for detection and/or diagnosis of SARS-CoV-2 by FDA under an Emergency Use Authorization (EUA).  This EUA will remain in effect (meaning this test can be used) for the duration of the COVID-19 declaration under Section 564(b)(1) of the Act, 21 U.S.C. section 360bbb-3(b)(1), unless the authorization is terminated or revoked sooner.  Performed at [facility], [address], [place], [place] [address]   Comprehensive metabolic panel     Status: Abnormal   Collection Time: 07/03/20  7:08 PM  Result Value Ref Range   Sodium 130 (L) 135 - 145 mmol/L   Potassium 4.1 3.5 - 5.1 mmol/L   Chloride 96 (L) 98 - 111 mmol/L   CO2 21 (L) 22 - 32 mmol/L   Glucose, Bld 313 (H) 70 - 99 mg/dL    Comment: Glucose reference range applies only to samples taken after fasting for at least 8 hours.   BUN 6 6 - 20 mg/dL   Creatinine, Ser 6.56 0.61 - 1.24 mg/dL   Calcium 8.8 (L) 8.9 - 10.3 mg/dL   Total Protein 7.9 6.5 - 8.1 g/dL   Albumin 3.1 (L) 3.5 - 5.0 g/dL   AST 15 15 - 41 U/L   ALT 26 0 - 44 U/L   Alkaline Phosphatase 59 38 - 126 U/L   Total Bilirubin  0.7 0.3 - 1.2 mg/dL   GFR calc non Af Amer >60 >60 mL/min   GFR calc Af Amer >60 >60 mL/min   Anion gap 13 5 - 15    Comment: Performed at [facility], [address], [place], [place] [address]  Lactic acid, plasma     Status: None   Collection Time: 07/03/20  7:08 PM  Result Value Ref Range   Lactic Acid, Venous 1.5 0.5 - 1.9 mmol/L    Comment: Performed at [facility], [address] [place], [place] [address]  CBC with Differential     Status: Abnormal   Collection Time: 07/03/20  7:08 PM  Result Value Ref Range   WBC 17.3 (H) 4.0 - 10.5 K/uL   RBC  5.73 4.22 - 5.81 MIL/uL   Hemoglobin 14.5 13.0 - 17.0 g/dL   HCT 01.0 39 - 52 %   MCV 80.3 80.0 - 100.0 fL   MCH 25.3 (L) 26.0 - 34.0 pg   MCHC 31.5 30.0 - 36.0 g/dL   RDW 93.2 35.5 - 73.2 %   Platelets 259 150 - 400 K/uL   nRBC 0.0 0.0 - 0.2 %   Neutrophils Relative % 80 %   Neutro Abs 13.8 (H) 1.7 - 7.7 K/uL   Lymphocytes Relative 11 %   Lymphs Abs 2.0 0.7 - 4.0 K/uL   Monocytes Relative 8 %   Monocytes Absolute 1.4 (H) 0 - 1 K/uL   Eosinophils Relative 0 %   Eosinophils Absolute 0.0 0 - 0 K/uL   Basophils Relative 0 %   Basophils Absolute 0.0 0 - 0 K/uL   Immature Granulocytes 1 %   Abs Immature Granulocytes 0.09 (H) 0.00 - 0.07 K/uL    Comment: Performed at [facility], [address] [place], [place] [address]  Protime-INR     Status: None   Collection Time: 07/03/20  7:08 PM  Result Value Ref Range   Prothrombin Time 13.7 11.4 - 15.2 seconds   INR 1.1 0.8 - 1.2    Comment: (NOTE) INR goal varies based on device and disease states. Performed at [facility], [address], [place], [place] [address]    CT ABDOMEN PELVIS W CONTRAST  Result Date: 07/04/2020 CLINICAL DATA:  Abdominal abscess/infection.  Perirectal abscess. EXAM: CT ABDOMEN AND PELVIS WITH CONTRAST TECHNIQUE: Multidetector CT imaging of the abdomen and pelvis was performed using the standard protocol following bolus administration of intravenous contrast. CONTRAST:  OMNIPAQUE IOHEXOL 300 MG/ML  SOLN COMPARISON:  None. FINDINGS: Lower chest: The lung bases are clear without focal nodule, mass, or airspace disease. Heart size is normal. No significant pleural or pericardial effusion is present. Hepatobiliary: Diffuse fatty infiltration liver is present. Layering high density fluid is present in the gallbladder. No stones are inflammatory changes are present. Common bile duct is within normal limits. Pancreas: Unremarkable. No pancreatic ductal dilatation or surrounding inflammatory changes.  Spleen: Normal in size without focal abnormality. Adrenals/Urinary Tract: Adrenal glands are normal bilaterally. Kidneys and ureters are within normal limits. No stone or mass lesion is present. No obstruction is present. Ureters and urinary bladder are within normal limits. Stomach/Bowel: Stomach hand duodenum are within normal limits. Small bowel is unremarkable. Terminal ileum is normal. The appendix and scratched at the appendix is visualized and within normal limits. The ascending and transverse colon are normal. Descending and sigmoid colon are normal. Vascular/Lymphatic: No significant vascular findings are present. No enlarged  abdominal or pelvic lymph nodes. Reproductive: Prostate gland is unremarkable. Other: Extensive gas is present in the perineum, left of midline. Gas is localized in the perineum with minimal extension to the perirectal area. Gas and inflammatory changes extend into the base of the scrotum. No significant changes are present on the right. No intraperitoneal inflammation or DVT is present. No free fluid is present within the peritoneal cavity. Musculoskeletal: Vertebral body heights and alignment are normal. No focal lytic or blastic lesions are present. Hips are located and within normal limits. IMPRESSION: 1. Extensive gas and inflammatory changes in the perineum, left of midline compatible with necrotizing fasciitis. 2. Gas and inflammatory changes extend into the base of the scrotum. 3. No intraperitoneal inflammation or DVT is present. 4. Hepatic steatosis. 5. Layering high density fluid in the gallbladder likely represents sludge. No acute cholecystitis is present. Critical Value/emergent results were called by telephone at the time of interpretation on 07/04/2020 at 6:55 am to provider Dutch Quint, who verbally acknowledged these results. Electronically Signed   By: Marin Roberts M.D.   On: 07/04/2020 07:00    Review of Systems  Constitutional: Positive for fever.   Gastrointestinal: Positive for rectal pain. Negative for abdominal distention and abdominal pain.  All other systems reviewed and are negative.   Blood pressure 114/69, pulse (!) 126, temperature 98.8 F (37.1 C), temperature source Oral, resp. rate 18, height 5\' 7"  (1.702 m), weight 136.1 kg, SpO2 96 %. Physical Exam Constitutional:      General: He is not in acute distress.    Appearance: Normal appearance. He is normal weight.  HENT:     Head: Normocephalic and atraumatic.     Right Ear: External ear normal.     Left Ear: External ear normal.     Nose: Nose normal.     Mouth/Throat:     Mouth: Mucous membranes are moist.     Pharynx: Oropharynx is clear.  Eyes:     General: No scleral icterus.    Extraocular Movements: Extraocular movements intact.     Pupils: Pupils are equal, round, and reactive to light.  Cardiovascular:     Rate and Rhythm: Normal rate and regular rhythm.     Pulses: Normal pulses.     Heart sounds: Normal heart sounds.  Pulmonary:     Effort: Pulmonary effort is normal.     Breath sounds: Normal breath sounds.  Abdominal:     General: There is no distension.     Palpations: Abdomen is soft.     Tenderness: There is no abdominal tenderness.  Genitourinary:    Comments: Perineum from base scrotum to anus tender, fluctuant with erythema  Musculoskeletal:     Cervical back: Normal range of motion and neck supple.     Right lower leg: No edema.     Left lower leg: No edema.  Lymphadenopathy:     Cervical: No cervical adenopathy.  Skin:    General: Skin is warm and dry.     Capillary Refill: Capillary refill takes less than 2 seconds.  Neurological:     General: No focal deficit present.     Mental Status: He is alert.      Assessment/Plan NSTI perineum -needs to go to OR this am for drainage/debridement. Discussed procedure and likely return to OR on Friday -will be admitted medicine for dm -abx given in ER -will discuss with medicine  ability to give second Pfizer dose this weekend as scheduled -he did not  want me to contact any family  Emelia LoronMatthew Jeevan Kalla, MD 07/04/2020, 7:42 AM

## 2020-07-04 NOTE — Progress Notes (Addendum)
Pharmacy Antibiotic Note  Scott Logan is a 24 y.o. male admitted on 07/03/2020 with asbcess, possible necrotizing fasciitis.  Pharmacy has been consulted for vancomycin dosing. Pt is febrile with Tmax 102.6 and WBC is elevated at 17.3. SCr is WNL. Lactic acid is <2.   Plan: Vancomycin 2gm IV x 1 then 1250mg  IV Q8H F/u renal fxn, C&S, clinical status and trough at [facility] F/u continuation of gram negative coverage and clindamycin  Addendum: Continuing cefepime 2gm IV Q8H  Height: 5\' 7"  (170.2 cm) Weight: 136.1 kg (300 lb) IBW/kg (Calculated) : 66.1  Temp (24hrs), Avg:101 F (38.3 C), Min:98.8 F (37.1 C), Max:102.6 F (39.2 C)  Recent Labs  Lab 07/03/20 1908  WBC 17.3*  CREATININE 0.91  LATICACIDVEN 1.5    Estimated Creatinine Clearance: 166.6 mL/min (by C-G formula based on SCr of 0.91 mg/dL).    No Known Allergies  Antimicrobials this admission: Vanc 8/12>> Cefepime x 1  8/12 Flagyl x 1 8/12 Clinda x 1 8/12  Dose adjustments this admission: N/A  Microbiology results: Pending  Thank you for allowing pharmacy to be a part of this patient's care.  Lillybeth Tal, 10/12 07/04/2020 7:20 AM

## 2020-07-04 NOTE — ED Provider Notes (Addendum)
Received pt in handout. He is a 24 year old male presents with a buttocks abscess for 2 days. He states he has had similar ones on his leg but never this bad. Pt is found to be newly diabetic here. He is febrile and tachycardic. Has a leukocytosis of 17. Lactate is normal. Blood cultures were obtained. CT of A/P is pending at shift change. He has had 2L of fluid, pain control, Clinda here.  Physical Exam  BP 114/69   Pulse (!) 126   Temp 98.8 F (37.1 C) (Oral)   Resp 18   Ht 5\' 7"  (1.702 m)   Wt 136.1 kg   SpO2 96%   BMI 46.99 kg/m   Physical Exam Vitals and nursing note reviewed.  Constitutional:      General: He is not in acute distress.    Appearance: He is well-developed. He is obese. He is not ill-appearing.  HENT:     Head: Normocephalic and atraumatic.  Eyes:     General: No scleral icterus.       Right eye: No discharge.        Left eye: No discharge.     Conjunctiva/sclera: Conjunctivae normal.     Pupils: Pupils are equal, round, and reactive to light.  Cardiovascular:     Rate and Rhythm: Tachycardia present.  Pulmonary:     Effort: Pulmonary effort is normal. No respiratory distress.  Abdominal:     General: There is no distension.  Genitourinary:    Comments: Left buttocks: Hard, indurated large abscess draining purulent fluid Musculoskeletal:     Cervical back: Normal range of motion.  Skin:    General: Skin is warm and dry.  Neurological:     Mental Status: He is alert and oriented to person, place, and time.  Psychiatric:        Behavior: Behavior normal.     ED Course/Procedures     Procedures  MDM   CT show large abscess consistent with a necrotizing fascitis. Will change antibiotics to Vanc, Cefepime, Flagyl. Wound culture was obtained. Pt has been NPO since yesterday. Will consult general surgery.  Discussed with and Dr. Nehemiah Settle. They will come see pt.  9:18 AM Discussed with IM team who will admit.     [name],  PA-C 07/04/20 0734    09/03/20, PA-C 07/04/20 09/03/20    1517, MD 07/09/20 2300

## 2020-07-04 NOTE — Op Note (Signed)
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 Harden Mo Anesthesia: General Estimated blood loss: 100 cc Specimens: Perineal tissue to pathology, cultures to microbiology Complications: None Drains: None Sponge and count was correct completion Disposition to recovery stable condition  Indications: This a 24 year old obese male who has new onset diabetes and a necrotizing soft tissue infection of the perineum.  I discussed going to the operating room for debridement.  We discussed multiple operations potentially.  Procedure: After informed consent was obtained he was first given antibiotics.  He was then taken to the operating room and placed under general anesthesia without complication.  He had SCDs in place.  I had to place the Foley catheter due to the fact that he was not circumcised I had the dilate his foreskin and eventually was able to place the Foley catheter after dilating his urethra as well.  The Foley catheter went in easily.  He was then placed in lithotomy and appropriately padded.  He was prepped and draped in a standard sterile surgical fashion.  Surgical timeout was then performed.  I made a large incision that extended from the base of his scrotum onto his left buttock.  I debrided all of the dead tissue with a combination of sharp dissection and cautery.  He did have necrotizing soft tissue infection.  I took cultures.  I took some of the tissue and sent it to pathology as well.  I debrided this back to healthy tissue.  The final size was 18 x 7 x 3 cm.  I obtained hemostasis.  I did packed this with a quick clot and see fold gauze to prevent any oozing.  Dressings were placed.  Tolerated this well was extubated transferred to recovery stable.

## 2020-07-04 NOTE — Anesthesia Postprocedure Evaluation (Signed)
Anesthesia Post Note  Patient: Scott Logan  Procedure(s) Performed: IRRIGATION AND DEBRIDEMENT PERINEAL ABSCESS (N/A Perineum)     Patient location during evaluation: PACU Anesthesia Type: General Level of consciousness: sedated and patient cooperative Pain management: pain level controlled Vital Signs Assessment: post-procedure vital signs reviewed and stable Respiratory status: spontaneous breathing Cardiovascular status: stable Anesthetic complications: no   No complications documented.  Last Vitals:  Vitals:   07/04/20 1500 07/04/20 1949  BP: 126/78 132/75  Pulse: (!) 117 (!) 123  Resp: 20 18  Temp: 36.8 C 37.7 C  SpO2: 94% 97%    Last Pain:  Vitals:   07/04/20 1949  TempSrc: Oral  PainSc:                  [name]

## 2020-07-04 NOTE — Social Work (Addendum)
CSW has requested 2nd Pfizer dose be administered while pt is in hospital before returning home; as requested by Carlena Bjornstad, PA. His procedural site will make it difficult to get to his appointment for second dose which was scheduled for 8/14.  Octavio Graves, MSW, LCSW [facility] Clinical Social Work

## 2020-07-04 NOTE — Progress Notes (Addendum)
Inpatient Diabetes Program Recommendations  AACE/ADA: New Consensus Statement on Inpatient Glycemic Control (2015)  Target Ranges:  Prepandial:   less than 140 mg/dL      Peak postprandial:   less than 180 mg/dL (1-2 hours)      Critically ill patients:  140 - 180 mg/dL   No results found for: GLUCAP, HGBA1C  Review of Glycemic Control  Diabetes history: None  Current orders for Inpatient glycemic control: None in ED  Inpatient Diabetes Program Recommendations:    Noted A1c pending. Glucose 313 on presentation  Consider CBGs and Novolog 0-15 units Q4 hours while NPO.  Will speak with pt this admission.  Addendum 1253 pm: A1c resulted at 13.1%.  -   Consider Lantus 20 units (0.15 units/kg)  Thanks, Christena Deem RN, MSN, BC-ADM Inpatient Diabetes Coordinator Team Pager [phone] (8a-5p)

## 2020-07-05 ENCOUNTER — Inpatient Hospital Stay: Payer: Self-pay

## 2020-07-05 ENCOUNTER — Encounter (HOSPITAL_COMMUNITY): Payer: Self-pay | Admitting: General Surgery

## 2020-07-05 DIAGNOSIS — Z23 Encounter for immunization: Secondary | ICD-10-CM

## 2020-07-05 LAB — GLUCOSE, CAPILLARY
Glucose-Capillary: 154 mg/dL — ABNORMAL HIGH (ref 70–99)
Glucose-Capillary: 173 mg/dL — ABNORMAL HIGH (ref 70–99)
Glucose-Capillary: 218 mg/dL — ABNORMAL HIGH (ref 70–99)
Glucose-Capillary: 218 mg/dL — ABNORMAL HIGH (ref 70–99)
Glucose-Capillary: 220 mg/dL — ABNORMAL HIGH (ref 70–99)
Glucose-Capillary: 221 mg/dL — ABNORMAL HIGH (ref 70–99)

## 2020-07-05 LAB — BASIC METABOLIC PANEL
Anion gap: 10 (ref 5–15)
BUN: 7 mg/dL (ref 6–20)
CO2: 21 mmol/L — ABNORMAL LOW (ref 22–32)
Calcium: 8.6 mg/dL — ABNORMAL LOW (ref 8.9–10.3)
Chloride: 105 mmol/L (ref 98–111)
Creatinine, Ser: 0.77 mg/dL (ref 0.61–1.24)
GFR calc Af Amer: >60 mL/min (ref >60)
GFR calc non Af Amer: >60 mL/min (ref >60)
Glucose, Bld: 224 mg/dL — ABNORMAL HIGH (ref 70–99)
Potassium: 4.2 mmol/L (ref 3.5–5.1)
Sodium: 136 mmol/L (ref 135–145)

## 2020-07-05 LAB — CBC
HCT: 38.3 % — ABNORMAL LOW (ref 39.0–52.0)
Hemoglobin: 12.2 g/dL — ABNORMAL LOW (ref 13.0–17.0)
MCH: 25.4 pg — ABNORMAL LOW (ref 26.0–34.0)
MCHC: 31.9 g/dL (ref 30.0–36.0)
MCV: 79.6 fL — ABNORMAL LOW (ref 80.0–100.0)
Platelets: 253 10*3/uL (ref 150–400)
RBC: 4.81 MIL/uL (ref 4.22–5.81)
RDW: 13.3 % (ref 11.5–15.5)
WBC: 14.5 10*3/uL — ABNORMAL HIGH (ref 4.0–10.5)
nRBC: 0 % (ref 0.0–0.2)

## 2020-07-05 LAB — SURGICAL PATHOLOGY

## 2020-07-05 MED ORDER — INSULIN ASPART 100 UNIT/ML ~~LOC~~ SOLN
10.0000 [IU] | Freq: Three times a day (TID) | SUBCUTANEOUS | Status: DC
  Administered 2020-07-05 (×3): 10 [IU] via SUBCUTANEOUS

## 2020-07-05 MED ORDER — INSULIN STARTER KIT- PEN NEEDLES (ENGLISH)
1.0000 | Freq: Once | Status: AC
  Administered 2020-07-05: 1
  Filled 2020-07-05: qty 1

## 2020-07-05 MED ORDER — LIVING WELL WITH DIABETES BOOK
Freq: Once | Status: AC
  Filled 2020-07-05: qty 1

## 2020-07-05 MED ORDER — INSULIN GLARGINE 100 UNIT/ML ~~LOC~~ SOLN
30.0000 [IU] | Freq: Every day | SUBCUTANEOUS | Status: DC
  Administered 2020-07-05: 30 [IU] via SUBCUTANEOUS
  Filled 2020-07-05 (×2): qty 0.3

## 2020-07-05 NOTE — Progress Notes (Addendum)
° °  Subjective:  Patient seen at bedside this AM. States he is doing well, no pain s/p surgery. Discussed diabetes diagnosis, saying he had no idea prior to arrival. No other complaints at this time.  Objective:  Vital signs in last 24 hours: Vitals:   07/04/20 2304 07/05/20 0215 07/05/20 0431 07/05/20 1009  BP: (!) 104/55 110/61 (!) 141/76 114/72  Pulse: (!) 123 (!) 117 (!) 116 (!) 103  Resp: 18 16 18 18   Temp: (!) 100.4 F (38 C) 100 F (37.8 C) (!) 100.4 F (38 C) 98.6 F (37 C)  TempSrc: Oral Oral Oral Oral  SpO2: 94% 92% 95% 96%  Weight:      Height:       Physical Exam: General: Resting in bed, no acute distress Skin: Dressing over the perineum, L buttock.  Assessment/Plan:  Principal Problem:   Necrotizing fasciitis of left buttock (HCC) Active Problems:   Hyperglycemia   Pseudo-Hyponatremia  Patient is 24yo male without past medical history admitted for necrotizing fasciitis of L buttock, perineum POD1 I&D, found to have diabetes on arrival.   #Necrotizing fasciitis s/p I&D Patient presenting with 2d hx of left buttock pain, fever. In ED, patient febrile, tachycardic, found to have purulent drainage from site. Labs revealed leukocytosis, hyperglycemia. CT abdomen pelvis showed extensive gas and inflammatory changes, compatible with necrotizing fascitis. Started on cefepime, vancomycin, clindamycin. General surgery consulted, performed I&D of abscess 8/12. Reports pain is well controlled this AM and he has been able to ambulate around room. Wound cultures growing GPC, GNR. BCx without growth thus far. Plan to continue abx, further debridement with surgery tomorrow AM. -POD1 s/p I&D -Cefepime, vanc, clinda (8/12-) -Dilaudid 0.5-1mg  q4 PRN -Further debridement tomorrow, 8/14 -F/u WCx, BCx -NPO @ midnight  #Diabetes mellitus On arrival, glucose 313, A1c 13.1. Patient was unaware that he had diabetes prior to admission. States he's often thirsty, drinking 2L/day for an  extended period of time. Denies polyuria. Reports only FHx of DM is older sister. Discussed with patient that glycemic control will be important for wound healing and preventing further infections. He does not have PCP, but willing to start at [facility] after discharge. Started on Lantus 20 QHS, SSI on arrival. CBG's continued to be elevated in 200's over last 12 hours. Plan to increase long-acting, add short-acting insulin with meals. Will continue to monitor CBGs, titrate insulin as necessary.  -CBG q4 -Lantus 30u QHS -Novolog 10u TID QC -SSI  #COVID Vaccine Patient was scheduled to receive 2nd dose of Pfizer on 8/14. Plan to give second dose today on 8/13, since it is unavailable during the weekends. -2nd Pfizer dose 8/13  Diet: HH, NPO @MN  IVF: LR 100/hr Bowel: Dulcolax DVTE PPX: subQ heparin Code Status: Full Prior to Admission Living Arrangement: home Anticipated Discharge Location: home Barriers to Discharge: necrotizing fasciitis Dispo: Anticipated discharge in approximately 2-3 day(s).   9/13, MD 07/05/2020, 12:41 PM Pager: [phone] After 5pm on weekdays and 1pm on weekends: On Call pager [phone]

## 2020-07-05 NOTE — Progress Notes (Signed)
   Covid-19 Vaccination Clinic  Name:  Scott Logan    MRN: [id number] DOB: [birth year]  07/05/2020  Mr. Loya was observed post Covid-19 immunization for 15 minutes without incident. He was provided with Vaccine Information Sheet and instruction to access the V-Safe system.   Mr. Malcomb was instructed to call 911 with any severe reactions post vaccine: Marland Kitchen Difficulty breathing  . Swelling of face and throat  . A fast heartbeat  . A bad rash all over body  . Dizziness and weakness   Immunizations Administered    Name Date Dose VIS Date Route   Pfizer COVID-19 Vaccine 07/05/2020 11:44 AM 0.3 mL 01/17/2019 Intramuscular   Manufacturer: [employer], [employer]   Lot: [id number]   NDC: [address]-5

## 2020-07-05 NOTE — Progress Notes (Signed)
Inpatient Diabetes Program Recommendations  AACE/ADA: New Consensus Statement on Inpatient Glycemic Control (2015)  Target Ranges:  Prepandial:   less than 140 mg/dL      Peak postprandial:   less than 180 mg/dL (1-2 hours)      Critically ill patients:  140 - 180 mg/dL   Lab Results  Component Value Date   GLUCAP 221 (H) 07/05/2020   HGBA1C 13.1 (H) 07/04/2020    Review of Glycemic Control  Diabetes history: New Diagnosis  Current orders for Inpatient glycemic control:  Lantus 30 units Daily Novolog 0-15 units Q4 hours Novolog 10 units tid meal coverage  Spoke with patient about new diabetes diagnosis.  Discussed A1C results (13.1%) and explained what an A1C is. Discussed basic pathophysiology of DM Type 2, basic home care, importance of checking CBGs and maintaining good CBG control to prevent long-term and short-term complications. Reviewed glucose and A1C goal. Reviewed signs and symptoms of hyperglycemia and hypoglycemia along with treatment for both. Discussed impact of nutrition (carbohydrates, portion sizes, zero sugar beverages), exercise, stress, sickness, and medications on diabetes control. Reviewed Living Well with diabetes booklet and encouraged patient to read through entire book. Informed patient that he maybe prescribed basal insulin. Discussed basal insulin in detail (how to take it, when to take it). Asked patient to check his glucose at least 2 times per day (fasting and alternating second check) and to keep a log book of glucose readings and insulin taken. Explained how the doctor he follows up with can use the log book to continue to make insulin adjustments if needed. Reviewed and demonstrated how to operate insulin pen. Patient verbalized understanding of information discussed and he states that he has no further questions at this time related to diabetes.   RNs to provide ongoing basic DM education at bedside with this patient and engage patient to actively check blood  glucose and administer insulin injections.   TOC consult placed for medication assistance and follow up  -  Given ReliOn glucose meter by my team -  Insulin pen needles order # [id number]   Thanks, Christena Deem RN, MSN, BC-ADM Inpatient Diabetes Coordinator Team Pager [phone] (8a-5p)

## 2020-07-05 NOTE — Progress Notes (Signed)
Pt sat on the bedside commode, wound dressing fell off. Wet to dry dressing applied. Notified MD

## 2020-07-05 NOTE — Social Work (Signed)
CSW has contacted MD team that pt will likely require assistance of TOC pharmacy/MATCH program. Pt currently uninsured, will contact [employer] to see if he can be screened for Medicaid. If unable to obtain LOG charity care then pt family will need to be taught appropriate wound care. TOC team following for assistance as able.  Octavio Graves, MSW, LCSW [facility] Clinical Social Work

## 2020-07-05 NOTE — Hospital Course (Addendum)
Discussed plan to discharge today per surgery. Diabetic instructor will come by to do a refresher on insulin use. Discussed follow up in clinic on Friday. To ensure dressing changes and insulin use is going ok.

## 2020-07-05 NOTE — Progress Notes (Signed)
1 Day Post-Op   Subjective/Chief Complaint: No complaints this am, packing was removed unfortunately last night    Objective: Vital signs in last 24 hours: Temp:  [98.3 F (36.8 C)-100.4 F (38 C)] 100.4 F (38 C) (08/13 0431) Pulse Rate:  [116-125] 116 (08/13 0431) Resp:  [16-30] 18 (08/13 0431) BP: (104-141)/(55-83) 141/76 (08/13 0431) SpO2:  [92 %-100 %] 95 % (08/13 0431) Weight:  [135.8 kg] 135.8 kg (08/12 1500) Last BM Date: 07/04/20  Intake/Output from previous day: 08/12 0701 - 08/13 0700 In: 1198.7 [I.V.:1173.7; IV Piggyback:25] Out: 3975 [Urine:3950; Blood:25] Intake/Output this shift: No intake/output data recorded.  dressing with some drainage as expected foley in place, abd nontender  Lab Results:  Recent Labs    07/03/20 1908 07/05/20 0258  WBC 17.3* 14.5*  HGB 14.5 12.2*  HCT 46.0 38.3*  PLT 259 253   BMET Recent Labs    07/03/20 1908 07/05/20 0258  NA 130* 136  K 4.1 4.2  CL 96* 105  CO2 21* 21*  GLUCOSE 313* 224*  BUN 6 7  CREATININE 0.91 0.77  CALCIUM 8.8* 8.6*   PT/INR Recent Labs    07/03/20 1908  LABPROT 13.7  INR 1.1   ABG No results for input(s): PHART, HCO3 in the last 72 hours.  Invalid input(s): PCO2, PO2  Studies/Results: CT ABDOMEN PELVIS W CONTRAST  Result Date: 07/04/2020 CLINICAL DATA:  Abdominal abscess/infection.  Perirectal abscess. EXAM: CT ABDOMEN AND PELVIS WITH CONTRAST TECHNIQUE: Multidetector CT imaging of the abdomen and pelvis was performed using the standard protocol following bolus administration of intravenous contrast. CONTRAST:  OMNIPAQUE IOHEXOL 300 MG/ML  SOLN COMPARISON:  None. FINDINGS: Lower chest: The lung bases are clear without focal nodule, mass, or airspace disease. Heart size is normal. No significant pleural or pericardial effusion is present. Hepatobiliary: Diffuse fatty infiltration liver is present. Layering high density fluid is present in the gallbladder. No stones are inflammatory  changes are present. Common bile duct is within normal limits. Pancreas: Unremarkable. No pancreatic ductal dilatation or surrounding inflammatory changes. Spleen: Normal in size without focal abnormality. Adrenals/Urinary Tract: Adrenal glands are normal bilaterally. Kidneys and ureters are within normal limits. No stone or mass lesion is present. No obstruction is present. Ureters and urinary bladder are within normal limits. Stomach/Bowel: Stomach hand duodenum are within normal limits. Small bowel is unremarkable. Terminal ileum is normal. The appendix and scratched at the appendix is visualized and within normal limits. The ascending and transverse colon are normal. Descending and sigmoid colon are normal. Vascular/Lymphatic: No significant vascular findings are present. No enlarged abdominal or pelvic lymph nodes. Reproductive: Prostate gland is unremarkable. Other: Extensive gas is present in the perineum, left of midline. Gas is localized in the perineum with minimal extension to the perirectal area. Gas and inflammatory changes extend into the base of the scrotum. No significant changes are present on the right. No intraperitoneal inflammation or DVT is present. No free fluid is present within the peritoneal cavity. Musculoskeletal: Vertebral body heights and alignment are normal. No focal lytic or blastic lesions are present. Hips are located and within normal limits. IMPRESSION: 1. Extensive gas and inflammatory changes in the perineum, left of midline compatible with necrotizing fasciitis. 2. Gas and inflammatory changes extend into the base of the scrotum. 3. No intraperitoneal inflammation or DVT is present. 4. Hepatic steatosis. 5. Layering high density fluid in the gallbladder likely represents sludge. No acute cholecystitis is present. Critical Value/emergent results were called by telephone  at the time of interpretation on 07/04/2020 at 6:55 am to provider Dutch Quint, who verbally acknowledged  these results. Electronically Signed   By: Marin Roberts M.D.   On: 07/04/2020 07:00    Anti-infectives: Anti-infectives (From admission, onward)   Start     Dose/Rate Route Frequency Ordered Stop   07/04/20 1700  vancomycin (VANCOREADY) IVPB 1250 mg/250 mL     Discontinue     1,250 mg 166.7 mL/hr over 90 Minutes Intravenous Every 8 hours 07/04/20 0736     07/04/20 1600  ceFEPIme (MAXIPIME) 2 g in sodium chloride 0.9 % 100 mL IVPB     Discontinue     2 g 200 mL/hr over 30 Minutes Intravenous Every 8 hours 07/04/20 0946     07/04/20 1300  clindamycin (CLEOCIN) IVPB 600 mg     Discontinue     600 mg 100 mL/hr over 30 Minutes Intravenous Every 8 hours 07/04/20 0942     07/04/20 0730  vancomycin (VANCOREADY) IVPB 2000 mg/400 mL        2,000 mg 200 mL/hr over 120 Minutes Intravenous  Once 07/04/20 0720 07/04/20 1001   07/04/20 0715  ceFEPIme (MAXIPIME) 2 g in sodium chloride 0.9 % 100 mL IVPB        2 g 200 mL/hr over 30 Minutes Intravenous  Once 07/04/20 0700 07/04/20 0925   07/04/20 0715  metroNIDAZOLE (FLAGYL) IVPB 500 mg        500 mg 100 mL/hr over 60 Minutes Intravenous  Once 07/04/20 0700 07/04/20 0925   07/04/20 0445  clindamycin (CLEOCIN) IVPB 600 mg        600 mg 100 mL/hr over 30 Minutes Intravenous  Once 07/04/20 0442 07/04/20 0603      Assessment/Plan: POD 1 debridement perineal NSTI- [name] -does not need dressing change today -plan for OR am for dressing change possible more debridment -continue abx -pharm dvt proph fine from my standpoint- can use lovenox -carb modified diet today, npo after mn -oob, pt consult  DM- per primary team Covid vaccine- per primary team, appreciate assistance  Emelia Loron 07/05/2020

## 2020-07-06 ENCOUNTER — Inpatient Hospital Stay (HOSPITAL_COMMUNITY): Payer: Self-pay | Admitting: Certified Registered Nurse Anesthetist

## 2020-07-06 ENCOUNTER — Encounter (HOSPITAL_COMMUNITY)
Admission: EM | Disposition: A | Discharge status: Home / Self Care | Payer: Self-pay | Source: Home / Self Care | Attending: Student in an Organized Health Care Education/Training Program

## 2020-07-06 DIAGNOSIS — M726 Necrotizing fasciitis: Principal | ICD-10-CM

## 2020-07-06 HISTORY — PX: DRESSING CHANGE UNDER ANESTHESIA: SHX5237

## 2020-07-06 LAB — AEROBIC CULTURE  (SUPERFICIAL SPECIMEN)

## 2020-07-06 LAB — GLUCOSE, CAPILLARY
Glucose-Capillary: 129 mg/dL — ABNORMAL HIGH (ref 70–99)
Glucose-Capillary: 154 mg/dL — ABNORMAL HIGH (ref 70–99)
Glucose-Capillary: 155 mg/dL — ABNORMAL HIGH (ref 70–99)
Glucose-Capillary: 164 mg/dL — ABNORMAL HIGH (ref 70–99)
Glucose-Capillary: 171 mg/dL — ABNORMAL HIGH (ref 70–99)
Glucose-Capillary: 178 mg/dL — ABNORMAL HIGH (ref 70–99)

## 2020-07-06 LAB — CBC
HCT: 36.4 % — ABNORMAL LOW (ref 39.0–52.0)
Hemoglobin: 11.5 g/dL — ABNORMAL LOW (ref 13.0–17.0)
MCH: 24.9 pg — ABNORMAL LOW (ref 26.0–34.0)
MCHC: 31.6 g/dL (ref 30.0–36.0)
MCV: 79 fL — ABNORMAL LOW (ref 80.0–100.0)
Platelets: 269 10*3/uL (ref 150–400)
RBC: 4.61 MIL/uL (ref 4.22–5.81)
RDW: 13.5 % (ref 11.5–15.5)
WBC: 10.4 10*3/uL (ref 4.0–10.5)
nRBC: 0 % (ref 0.0–0.2)

## 2020-07-06 LAB — SURGICAL PCR SCREEN
MRSA, PCR: NEGATIVE
Staphylococcus aureus: POSITIVE — AB

## 2020-07-06 LAB — AEROBIC CULTURE W GRAM STAIN (SUPERFICIAL SPECIMEN)

## 2020-07-06 SURGERY — REPLACEMENT, DRESSING, WITH ANESTHESIA
Anesthesia: General | Site: Perineum

## 2020-07-06 MED ORDER — OXYCODONE HCL 5 MG PO TABS: 5.0000 mg | ORAL_TABLET | Freq: Once | ORAL | Status: DC | PRN

## 2020-07-06 MED ORDER — BUPIVACAINE HCL (PF) 0.25 % IJ SOLN
INTRAMUSCULAR | Status: AC
  Filled 2020-07-06: qty 30

## 2020-07-06 MED ORDER — SUCCINYLCHOLINE CHLORIDE 200 MG/10ML IV SOSY
PREFILLED_SYRINGE | INTRAVENOUS | Status: AC
  Filled 2020-07-06: qty 10

## 2020-07-06 MED ORDER — MIDAZOLAM HCL 2 MG/2ML IJ SOLN
INTRAMUSCULAR | Status: AC
  Filled 2020-07-06: qty 2

## 2020-07-06 MED ORDER — 0.9 % SODIUM CHLORIDE (POUR BTL) OPTIME
TOPICAL | Status: DC | PRN
  Administered 2020-07-06: 1000 mL

## 2020-07-06 MED ORDER — FENTANYL CITRATE (PF) 100 MCG/2ML IJ SOLN
INTRAMUSCULAR | Status: DC | PRN
  Administered 2020-07-06: 100 ug via INTRAVENOUS
  Administered 2020-07-06: 50 ug via INTRAVENOUS

## 2020-07-06 MED ORDER — ONDANSETRON HCL 4 MG/2ML IJ SOLN
INTRAMUSCULAR | Status: AC
  Filled 2020-07-06: qty 2

## 2020-07-06 MED ORDER — CHLORHEXIDINE GLUCONATE 0.12 % MT SOLN
15.0000 mL | Freq: Once | OROMUCOSAL | Status: AC
  Filled 2020-07-06: qty 15

## 2020-07-06 MED ORDER — FENTANYL CITRATE (PF) 250 MCG/5ML IJ SOLN
INTRAMUSCULAR | Status: AC
  Filled 2020-07-06: qty 5

## 2020-07-06 MED ORDER — PROPOFOL 10 MG/ML IV BOLUS
INTRAVENOUS | Status: DC | PRN
Start: 2020-07-06 — End: 2020-07-06
  Administered 2020-07-06: 200 mg via INTRAVENOUS

## 2020-07-06 MED ORDER — LIDOCAINE 2% (20 MG/ML) 5 ML SYRINGE
INTRAMUSCULAR | Status: AC
  Filled 2020-07-06: qty 5

## 2020-07-06 MED ORDER — LIDOCAINE HCL (CARDIAC) PF 100 MG/5ML IV SOSY
PREFILLED_SYRINGE | INTRAVENOUS | Status: DC | PRN
Start: 2020-07-06 — End: 2020-07-06
  Administered 2020-07-06: 80 mg via INTRATRACHEAL

## 2020-07-06 MED ORDER — ACETAMINOPHEN 10 MG/ML IV SOLN: 1000.0000 mg | Freq: Once | INTRAVENOUS | Status: DC | PRN

## 2020-07-06 MED ORDER — ACETAMINOPHEN 160 MG/5ML PO SOLN: 1000.0000 mg | Freq: Once | ORAL | Status: DC | PRN

## 2020-07-06 MED ORDER — INSULIN ASPART 100 UNIT/ML ~~LOC~~ SOLN
12.0000 [IU] | Freq: Three times a day (TID) | SUBCUTANEOUS | Status: DC
  Administered 2020-07-06 – 2020-07-07 (×4): 12 [IU] via SUBCUTANEOUS

## 2020-07-06 MED ORDER — SUCCINYLCHOLINE CHLORIDE 20 MG/ML IJ SOLN
INTRAMUSCULAR | Status: DC | PRN
Start: 2020-07-06 — End: 2020-07-06
  Administered 2020-07-06: 180 mg via INTRAVENOUS

## 2020-07-06 MED ORDER — OXYCODONE HCL 5 MG/5ML PO SOLN: 5.0000 mg | Freq: Once | ORAL | Status: DC | PRN

## 2020-07-06 MED ORDER — ONDANSETRON HCL 4 MG/2ML IJ SOLN
INTRAMUSCULAR | Status: DC | PRN
  Administered 2020-07-06: 4 mg via INTRAVENOUS

## 2020-07-06 MED ORDER — PROPOFOL 10 MG/ML IV BOLUS
INTRAVENOUS | Status: AC
  Filled 2020-07-06: qty 20

## 2020-07-06 MED ORDER — MUPIROCIN 2 % EX OINT
1.0000 "application " | TOPICAL_OINTMENT | Freq: Two times a day (BID) | CUTANEOUS | Status: DC
  Administered 2020-07-06 – 2020-07-09 (×7): 1 via NASAL
  Filled 2020-07-06 (×3): qty 22

## 2020-07-06 MED ORDER — CHLORHEXIDINE GLUCONATE 0.12 % MT SOLN
OROMUCOSAL | Status: AC
  Administered 2020-07-06: 15 mL via OROMUCOSAL
  Filled 2020-07-06: qty 15

## 2020-07-06 MED ORDER — DEXAMETHASONE SODIUM PHOSPHATE 10 MG/ML IJ SOLN
INTRAMUSCULAR | Status: AC
  Filled 2020-07-06: qty 1

## 2020-07-06 MED ORDER — INSULIN GLARGINE 100 UNIT/ML ~~LOC~~ SOLN
35.0000 [IU] | Freq: Every day | SUBCUTANEOUS | Status: DC
  Administered 2020-07-06: 35 [IU] via SUBCUTANEOUS
  Filled 2020-07-06 (×2): qty 0.35

## 2020-07-06 MED ORDER — INSULIN GLARGINE 100 UNIT/ML ~~LOC~~ SOLN
35.0000 [IU] | Freq: Every day | SUBCUTANEOUS | Status: DC
  Filled 2020-07-06 (×3): qty 0.35

## 2020-07-06 MED ORDER — FENTANYL CITRATE (PF) 100 MCG/2ML IJ SOLN
25.0000 ug | INTRAMUSCULAR | Status: DC | PRN
Start: 2020-07-06 — End: 2020-07-06

## 2020-07-06 MED ORDER — ROCURONIUM BROMIDE 10 MG/ML (PF) SYRINGE
PREFILLED_SYRINGE | INTRAVENOUS | Status: AC
  Filled 2020-07-06: qty 10

## 2020-07-06 MED ORDER — INSULIN ASPART 100 UNIT/ML ~~LOC~~ SOLN
0.0000 [IU] | Freq: Three times a day (TID) | SUBCUTANEOUS | Status: DC
  Administered 2020-07-06 – 2020-07-07 (×2): 2 [IU] via SUBCUTANEOUS
  Administered 2020-07-07: 8 [IU] via SUBCUTANEOUS
  Administered 2020-07-07: 4 [IU] via SUBCUTANEOUS
  Administered 2020-07-08 – 2020-07-09 (×5): 2 [IU] via SUBCUTANEOUS

## 2020-07-06 MED ORDER — ACETAMINOPHEN 500 MG PO TABS
1000.0000 mg | ORAL_TABLET | Freq: Once | ORAL | Status: DC | PRN
Start: 2020-07-06 — End: 2020-07-06

## 2020-07-06 MED ORDER — MIDAZOLAM HCL 5 MG/5ML IJ SOLN
INTRAMUSCULAR | Status: DC | PRN
  Administered 2020-07-06: 2 mg via INTRAVENOUS

## 2020-07-06 SURGICAL SUPPLY — 31 items
BLADE SURG 10 STRL SS (BLADE) ×2 IMPLANT
BNDG GAUZE ELAST 4 BULKY (GAUZE/BANDAGES/DRESSINGS) ×2 IMPLANT
CANISTER SUCT 3000ML PPV (MISCELLANEOUS) ×2 IMPLANT
COVER SURGICAL LIGHT HANDLE (MISCELLANEOUS) ×2 IMPLANT
DRAPE UTILITY XL STRL (DRAPES) IMPLANT
DRSG PAD ABDOMINAL 8X10 ST (GAUZE/BANDAGES/DRESSINGS) ×2 IMPLANT
ELECT REM PT RETURN 9FT ADLT (ELECTROSURGICAL) ×2
ELECTRODE REM PT RTRN 9FT ADLT (ELECTROSURGICAL) ×1 IMPLANT
GAUZE SPONGE 4X4 12PLY STRL (GAUZE/BANDAGES/DRESSINGS) IMPLANT
GLOVE BIO SURGEON STRL SZ7.5 (GLOVE) ×2 IMPLANT
GLOVE BIOGEL PI IND STRL 8 (GLOVE) ×1 IMPLANT
GLOVE BIOGEL PI INDICATOR 8 (GLOVE) ×1
GOWN STRL REUS W/ TWL LRG LVL3 (GOWN DISPOSABLE) ×1 IMPLANT
GOWN STRL REUS W/ TWL XL LVL3 (GOWN DISPOSABLE) ×1 IMPLANT
GOWN STRL REUS W/TWL LRG LVL3 (GOWN DISPOSABLE) ×1
GOWN STRL REUS W/TWL XL LVL3 (GOWN DISPOSABLE) ×1
HANDPIECE INTERPULSE COAX TIP (DISPOSABLE) ×2
KIT BASIN OR (CUSTOM PROCEDURE TRAY) ×2 IMPLANT
KIT TURNOVER KIT B (KITS) ×2 IMPLANT
NS IRRIG 1000ML POUR BTL (IV SOLUTION) ×2 IMPLANT
PACK LITHOTOMY IV (CUSTOM PROCEDURE TRAY) ×2 IMPLANT
PAD ARMBOARD 7.5X6 YLW CONV (MISCELLANEOUS) ×2 IMPLANT
PENCIL SMOKE EVACUATOR (MISCELLANEOUS) ×2 IMPLANT
SET HNDPC FAN SPRY TIP SCT (DISPOSABLE) ×1 IMPLANT
SPONGE LAP 18X18 RF (DISPOSABLE) ×2 IMPLANT
SWAB COLLECTION DEVICE MRSA (MISCELLANEOUS) IMPLANT
SWAB CULTURE ESWAB REG 1ML (MISCELLANEOUS) IMPLANT
TOWEL GREEN STERILE (TOWEL DISPOSABLE) ×2 IMPLANT
TUBE CONNECTING 12X1/4 (SUCTIONS) ×2 IMPLANT
UNDERPAD 30X36 HEAVY ABSORB (UNDERPADS AND DIAPERS) ×2 IMPLANT
YANKAUER SUCT BULB TIP NO VENT (SUCTIONS) ×2 IMPLANT

## 2020-07-06 NOTE — Anesthesia Preprocedure Evaluation (Signed)
Anesthesia Evaluation  Patient identified by MRN, date of birth, ID band Patient awake    Reviewed: Allergy & Precautions, NPO status , Patient's Chart, lab work & pertinent test results  History of Anesthesia Complications Negative for: history of anesthetic complications  Airway Mallampati: III  TM Distance: >3 FB Neck ROM: Full    Dental  (+) Dental Advisory Given, Teeth Intact   Pulmonary asthma , neg recent URI,    breath sounds clear to auscultation       Cardiovascular negative cardio ROS   Rhythm:Regular     Neuro/Psych negative neurological ROS  negative psych ROS   GI/Hepatic negative GI ROS, Neg liver ROS,   Endo/Other  diabetes, Poorly ControlledMorbid obesity  Renal/GU negative Renal ROS     Musculoskeletal negative musculoskeletal ROS (+)   Abdominal (+) + obese,   Peds  Hematology negative hematology ROS (+)   Anesthesia Other Findings   Reproductive/Obstetrics                             Anesthesia Physical Anesthesia Plan  ASA: III  Anesthesia Plan: General   Post-op Pain Management:    Induction: Intravenous  PONV Risk Score and Plan: 2 and Ondansetron and Treatment may vary due to age or medical condition  Airway Management Planned: Oral ETT  Additional Equipment: None  Intra-op Plan:   Post-operative Plan: Extubation in OR  Informed Consent: I have reviewed the patients History and Physical, chart, labs and discussed the procedure including the risks, benefits and alternatives for the proposed anesthesia with the patient or authorized representative who has indicated his/her understanding and acceptance.     Dental advisory given  Plan Discussed with: CRNA and Surgeon  Anesthesia Plan Comments:         Anesthesia Quick Evaluation

## 2020-07-06 NOTE — Transfer of Care (Signed)
Immediate Anesthesia Transfer of Care Note  Patient: Lenn Cal Pesnell  Procedure(s) Performed: DRESSING CHANGE UNDER ANESTHESIA WITH WASH-OUT OF PERINEUM (N/A Perineum)  Patient Location: PACU  Anesthesia Type:General  Level of Consciousness: awake, alert , oriented and patient cooperative  Airway & Oxygen Therapy: Patient Spontanous Breathing and Patient connected to nasal cannula oxygen  Post-op Assessment: Report given to RN and Post -op Vital signs reviewed and stable  Post vital signs: Reviewed and stable  Last Vitals:  Vitals Value Taken Time  BP 122/70 07/06/20 0930  Temp 36.7 C 07/06/20 0930  Pulse 101 07/06/20 0933  Resp 25 07/06/20 0933  SpO2 92 % 07/06/20 0933  Vitals shown include unvalidated device data.  Last Pain:  Vitals:   07/06/20 0421  TempSrc: Oral  PainSc:       Patients Stated Pain Goal: 2 (07/05/20 1456)  Complications: No complications documented.

## 2020-07-06 NOTE — Anesthesia Procedure Notes (Signed)
Procedure Name: Intubation Date/Time: 07/06/2020 8:54 AM Performed by: Sonda Primes, CRNA Pre-anesthesia Checklist: Patient identified, Emergency Drugs available, Suction available, Patient being monitored and Timeout performed Patient Re-evaluated:Patient Re-evaluated prior to induction Oxygen Delivery Method: Ambu bag Preoxygenation: Pre-oxygenation with 100% oxygen Induction Type: IV induction, Rapid sequence and Cricoid Pressure applied Ventilation: Mask ventilation without difficulty Laryngoscope Size: Glidescope and 4 Grade View: Grade II Tube type: Subglottic suction tube Tube size: 8.0 mm Number of attempts: 1 Airway Equipment and Method: Stylet and Video-laryngoscopy Placement Confirmation: ETT inserted through vocal cords under direct vision,  breath sounds checked- equal and bilateral and CO2 detector Secured at: 24 cm Tube secured with: Tape Dental Injury: Teeth and Oropharynx as per pre-operative assessment

## 2020-07-06 NOTE — Anesthesia Postprocedure Evaluation (Signed)
Anesthesia Post Note  Patient: Scott Logan  Procedure(s) Performed: DRESSING CHANGE UNDER ANESTHESIA WITH WASH-OUT OF PERINEUM (N/A Perineum)     Patient location during evaluation: PACU Anesthesia Type: General Level of consciousness: awake and alert Pain management: pain level controlled Vital Signs Assessment: post-procedure vital signs reviewed and stable Respiratory status: spontaneous breathing, nonlabored ventilation, respiratory function stable and patient connected to nasal cannula oxygen Cardiovascular status: blood pressure returned to baseline and stable Postop Assessment: no apparent nausea or vomiting Anesthetic complications: no   No complications documented.  Last Vitals:  Vitals:   07/06/20 1017 07/06/20 1450  BP: 130/89 132/87  Pulse: 93 100  Resp: 18 19  Temp: 36.8 C 36.9 C  SpO2: 94% 96%    Last Pain:  Vitals:   07/06/20 1450  TempSrc: Oral  PainSc:                  [name]

## 2020-07-06 NOTE — Progress Notes (Signed)
Day of Surgery   Subjective/Chief Complaint: Pt with no acute changes   Objective: Vital signs in last 24 hours: Temp:  [98.6 F (37 C)-99.3 F (37.4 C)] 98.8 F (37.1 C) (08/14 0421) Pulse Rate:  [96-109] 96 (08/14 0421) Resp:  [18-20] 18 (08/14 0421) BP: (114-140)/(71-93) 140/80 (08/14 0421) SpO2:  [96 %-98 %] 97 % (08/14 0421) Last BM Date: 07/01/20  Intake/Output from previous day: 08/13 0701 - 08/14 0700 In: 3501.2 [P.O.:600; I.V.:826.3; IV Piggyback:2075] Out: 1050 [Urine:1050] Intake/Output this shift: No intake/output data recorded.  PE:  Constitutional: No acute distress, conversant, appears states age. Eyes: Anicteric sclerae, moist conjunctiva, no lid lag Lungs: Clear to auscultation bilaterally, normal respiratory effort CV: regular rate and rhythm, no murmurs, no peripheral edema, pedal pulses 2+ GI: Soft, no masses or hepatosplenomegaly, non-tender to palpation Skin: No rashes, palpation reveals normal turgor, packing place in wound Psychiatric: appropriate judgment and insight, oriented to person, place, and time   Lab Results:  Recent Labs    07/05/20 0258 07/06/20 0404  WBC 14.5* 10.4  HGB 12.2* 11.5*  HCT 38.3* 36.4*  PLT 253 269   BMET Recent Labs    07/03/20 1908 07/05/20 0258  NA 130* 136  K 4.1 4.2  CL 96* 105  CO2 21* 21*  GLUCOSE 313* 224*  BUN 6 7  CREATININE 0.91 0.77  CALCIUM 8.8* 8.6*   PT/INR Recent Labs    07/03/20 1908  LABPROT 13.7  INR 1.1   ABG No results for input(s): PHART, HCO3 in the last 72 hours.  Invalid input(s): PCO2, PO2  Studies/Results: No results found.  Anti-infectives: Anti-infectives (From admission, onward)   Start     Dose/Rate Route Frequency Ordered Stop   07/04/20 1700  [MAR Hold]  vancomycin (VANCOREADY) IVPB 1250 mg/250 mL     Discontinue     (MAR Hold since Sat 07/06/2020 at 0811.Hold Reason: Transfer to a Procedural area.)   1,250 mg 166.7 mL/hr over 90 Minutes Intravenous Every 8  hours 07/04/20 0736     07/04/20 1600  [MAR Hold]  ceFEPIme (MAXIPIME) 2 g in sodium chloride 0.9 % 100 mL IVPB     Discontinue     (MAR Hold since Sat 07/06/2020 at 0811.Hold Reason: Transfer to a Procedural area.)   2 g 200 mL/hr over 30 Minutes Intravenous Every 8 hours 07/04/20 0946     07/04/20 1300  clindamycin (CLEOCIN) IVPB 600 mg  Status:  Discontinued        600 mg 100 mL/hr over 30 Minutes Intravenous Every 8 hours 07/04/20 0942 07/06/20 0700   07/04/20 0730  vancomycin (VANCOREADY) IVPB 2000 mg/400 mL        2,000 mg 200 mL/hr over 120 Minutes Intravenous  Once 07/04/20 0720 07/04/20 1001   07/04/20 0715  ceFEPIme (MAXIPIME) 2 g in sodium chloride 0.9 % 100 mL IVPB        2 g 200 mL/hr over 30 Minutes Intravenous  Once 07/04/20 0700 07/04/20 0925   07/04/20 0715  metroNIDAZOLE (FLAGYL) IVPB 500 mg        500 mg 100 mL/hr over 60 Minutes Intravenous  Once 07/04/20 0700 07/04/20 0925   07/04/20 0445  clindamycin (CLEOCIN) IVPB 600 mg        600 mg 100 mL/hr over 30 Minutes Intravenous  Once 07/04/20 0442 07/04/20 0603      Assessment/Plan: s/p Procedure(s): DRESSING CHANGE UNDER ANESTHESIA WITH POSSIBLE DEBRIDEMENT (N/A) Plan for OR today for D&I of  wound    LOS: 2 days    Scott Logan 07/06/2020

## 2020-07-06 NOTE — Op Note (Signed)
07/06/2020  9:14 AM  PATIENT:  Scott Logan  24 y.o. male  PRE-OPERATIVE DIAGNOSIS:  NSTI  POST-OPERATIVE DIAGNOSIS:  NSTI 17x6cm  PROCEDURE:  Procedure(s): DRESSING CHANGE UNDER ANESTHESIA WITH WASH-OUT OF PERINEUM (N/A)  SURGEON:  Surgeon(s) and Role:    * Axel Filler, MD - Primary  ANESTHESIA:   general  EBL:  minimal   BLOOD ADMINISTERED:none  DRAINS: none   LOCAL MEDICATIONS USED:  NONE  SPECIMEN:  No Specimen  DISPOSITION OF SPECIMEN:  N/A  COUNTS:  YES  TOURNIQUET:  * No tourniquets in log *  DICTATION: .Dragon Dictation  Indication for procedure: Patient is a 24 year old male who previously underwent I&D of perineal soft tissue infection.  Patient was scheduled for return to the operating for second look.  Findings: Patient had a 17 x 6 cm area of debridement.  This all appeared to be healthy.  There were no further areas of necrosis or pockets of purulence.  This was irrigated out with a pulse lavage.  Betadine soaked Kerlix was placed into the wound.  Details procedure: After the patient was consented he was taken back to the OR and placed in supine position with bilateral SCDs in place.  He underwent general endotracheal intubation.  Patient was then placed in lithotomy position.  Patient prepped draped in standard fashion.  A timeout was called all facts verified.  The previous gauze was removed.  The area was probed circumferentially.  There was no further pockets of purulence and/or what appeared to be necrosis.  At this time a pulse lavage was used to irrigate the entire perineum.  All tissue appeared to be well perfused.  There is no further areas of necrosis.  No debridement took place.  The area measured 17 x 6 cm.  At this time Betadine soaked Kerlix was then placed into the wound.  Patient's perineum was then dressed with ABD pads, and mesh panties.  Patient taught the procedure well was taken to the recovery in stable  condition.   Excisional debridement:  Tool used for debridement: none  Frequency of surgical debridement.   Second debridement  Measurement of total devitalized tissue (wound surface) before and after surgical debridement.   17x3cm and 17x6cm  Area and depth of devitalized tissue removed from wound.  17x6x5cm  Blood loss and description of tissue removed.  none  Was there any viable tissue removed (measurements): no  PLAN OF CARE: Admit to inpatient   PATIENT DISPOSITION:  PACU - hemodynamically stable.   Delay start of Pharmacological VTE agent (>24hrs) due to surgical blood loss or risk of bleeding: no

## 2020-07-06 NOTE — Progress Notes (Signed)
   Subjective:  Patient seen at bedside this AM. Reports he's doing well. States he's had some cough, body aches after 2nd dose of Pfizer COVID vaccine yesterday, but none currently. Says he's ready for surgery this AM.  Objective:  Vital signs in last 24 hours: Vitals:   07/05/20 1009 07/05/20 1333 07/05/20 2135 07/06/20 0421  BP: 114/72 (!) 133/93 123/71 140/80  Pulse: (!) 103 (!) 103 (!) 109 96  Resp: 18 20 20 18   Temp: 98.6 F (37 C) 99.3 F (37.4 C) 99 F (37.2 C) 98.8 F (37.1 C)  TempSrc: Oral Oral Oral Oral  SpO2: 96% 97% 98% 97%  Weight:      Height:       Physical Exam: General: Resting comfortably in bed, no acute distress CV: Distal pulses 2+ MSK: No pitting edema bilaterally  Assessment/Plan:  Principal Problem:   Necrotizing fasciitis of left buttock (HCC) Active Problems:   Diabetes [facility])  Patient is 24yo male without past medical history admitted for necrotizing fasciitis of L buttock, perineum POD2 I&D, found to have diabetes on arrival.  #Necrotizing fasciitis s/p I&D 8/12 POD2 I&D. Patient doing well, afebrile, HDS. WBC 10.4 < 14.5 < 17.3. Plan to continue empiric abx, will d/c clindamycin today. WCx growing GPC, GNR. BCx without growth. General surgery following, planning for dressing change, possibly further debridement this AM. -POD2 s/p I&D -Dressing change, possible further debridement today -d/c clindamycin -Cefepime, vanc (8/12-) -Dilaudid 0.5-1mg  q4 PRN -F/u WCx, BCx  #Newly diagnosed diabetes mellitus On arrival, A1c 13.1. CBGs currently 150-170's. Will continue to titrate insulin to promote wound healing. Plan to increase Lantus, Novolog, and add meal time coverage. Patient will need to follow-up with primary care for further medication management. [facility]. -CBGs -Lantus 35u QHS -Novolog 12u TID QC -SSI  Diet: HH IVF: LR 100/hr Bowel: Dulcolax DVT PPX: subQ hep Code Status: Full Prior to Admission Living  Arrangement: home Anticipated Discharge Location: home Barriers to Discharge: stabilize infection, sugars Dispo: Anticipated discharge in approximately 1-2 day(s).   01-20-2000, MD 07/06/2020, 5:46 AM Pager: [phone] After 5pm on weekdays and 1pm on weekends: On Call pager [phone]

## 2020-07-07 ENCOUNTER — Encounter (HOSPITAL_COMMUNITY): Payer: Self-pay | Admitting: General Surgery

## 2020-07-07 DIAGNOSIS — E119 Type 2 diabetes mellitus without complications: Secondary | ICD-10-CM

## 2020-07-07 DIAGNOSIS — B951 Streptococcus, group B, as the cause of diseases classified elsewhere: Secondary | ICD-10-CM

## 2020-07-07 DIAGNOSIS — D649 Anemia, unspecified: Secondary | ICD-10-CM

## 2020-07-07 DIAGNOSIS — I82409 Acute embolism and thrombosis of unspecified deep veins of unspecified lower extremity: Secondary | ICD-10-CM

## 2020-07-07 LAB — CBC
HCT: 37.9 % — ABNORMAL LOW (ref 39.0–52.0)
Hemoglobin: 11.9 g/dL — ABNORMAL LOW (ref 13.0–17.0)
MCH: 24.9 pg — ABNORMAL LOW (ref 26.0–34.0)
MCHC: 31.4 g/dL (ref 30.0–36.0)
MCV: 79.3 fL — ABNORMAL LOW (ref 80.0–100.0)
Platelets: 336 10*3/uL (ref 150–400)
RBC: 4.78 MIL/uL (ref 4.22–5.81)
RDW: 13.5 % (ref 11.5–15.5)
WBC: 8.6 10*3/uL (ref 4.0–10.5)
nRBC: 0 % (ref 0.0–0.2)

## 2020-07-07 LAB — BASIC METABOLIC PANEL
Anion gap: 9 (ref 5–15)
BUN: 7 mg/dL (ref 6–20)
CO2: 23 mmol/L (ref 22–32)
Calcium: 8.8 mg/dL — ABNORMAL LOW (ref 8.9–10.3)
Chloride: 105 mmol/L (ref 98–111)
Creatinine, Ser: 0.62 mg/dL (ref 0.61–1.24)
GFR calc Af Amer: >60 mL/min (ref >60)
GFR calc non Af Amer: >60 mL/min (ref >60)
Glucose, Bld: 159 mg/dL — ABNORMAL HIGH (ref 70–99)
Potassium: 3.7 mmol/L (ref 3.5–5.1)
Sodium: 137 mmol/L (ref 135–145)

## 2020-07-07 LAB — IRON AND TIBC
Iron: 22 ug/dL — ABNORMAL LOW (ref 45–182)
Saturation Ratios: 11 % — ABNORMAL LOW (ref 17.9–39.5)
TIBC: 209 ug/dL — ABNORMAL LOW (ref 250–450)
UIBC: 187 ug/dL

## 2020-07-07 LAB — VANCOMYCIN, TROUGH: Vancomycin Tr: 11 ug/mL — ABNORMAL LOW (ref 15–20)

## 2020-07-07 LAB — GLUCOSE, CAPILLARY
Glucose-Capillary: 100 mg/dL — ABNORMAL HIGH (ref 70–99)
Glucose-Capillary: 119 mg/dL — ABNORMAL HIGH (ref 70–99)
Glucose-Capillary: 129 mg/dL — ABNORMAL HIGH (ref 70–99)
Glucose-Capillary: 171 mg/dL — ABNORMAL HIGH (ref 70–99)
Glucose-Capillary: 210 mg/dL — ABNORMAL HIGH (ref 70–99)
Glucose-Capillary: 215 mg/dL — ABNORMAL HIGH (ref 70–99)

## 2020-07-07 LAB — FERRITIN: Ferritin: 451 ng/mL — ABNORMAL HIGH (ref 24–336)

## 2020-07-07 MED ORDER — ENOXAPARIN SODIUM 40 MG/0.4ML ~~LOC~~ SOLN: 40.0000 mg | SUBCUTANEOUS | Status: DC

## 2020-07-07 MED ORDER — INSULIN ASPART 100 UNIT/ML ~~LOC~~ SOLN
15.0000 [IU] | Freq: Three times a day (TID) | SUBCUTANEOUS | Status: AC
  Administered 2020-07-07 (×2): 15 [IU] via SUBCUTANEOUS

## 2020-07-07 MED ORDER — HYDROMORPHONE HCL 1 MG/ML IJ SOLN: 0.5000 mg | INTRAMUSCULAR | Status: DC | PRN

## 2020-07-07 MED ORDER — OXYCODONE HCL 5 MG PO TABS
5.0000 mg | ORAL_TABLET | ORAL | Status: DC | PRN
  Administered 2020-07-07: 10 mg via ORAL
  Administered 2020-07-07: 5 mg via ORAL
  Administered 2020-07-08 – 2020-07-09 (×3): 10 mg via ORAL
  Filled 2020-07-07 (×3): qty 2
  Filled 2020-07-07: qty 1
  Filled 2020-07-07: qty 2

## 2020-07-07 MED ORDER — ACETAMINOPHEN 325 MG PO TABS
650.0000 mg | ORAL_TABLET | Freq: Four times a day (QID) | ORAL | Status: DC | PRN
  Administered 2020-07-07: 650 mg via ORAL
  Filled 2020-07-07: qty 2

## 2020-07-07 MED ORDER — VANCOMYCIN HCL 1500 MG/300ML IV SOLN
1500.0000 mg | Freq: Three times a day (TID) | INTRAVENOUS | Status: DC
  Administered 2020-07-07 – 2020-07-08 (×3): 1500 mg via INTRAVENOUS
  Filled 2020-07-07 (×3): qty 300

## 2020-07-07 MED ORDER — INSULIN GLARGINE 100 UNIT/ML ~~LOC~~ SOLN
40.0000 [IU] | Freq: Every day | SUBCUTANEOUS | Status: DC
  Administered 2020-07-07 – 2020-07-08 (×2): 40 [IU] via SUBCUTANEOUS
  Filled 2020-07-07 (×3): qty 0.4

## 2020-07-07 MED ORDER — ENOXAPARIN SODIUM 80 MG/0.8ML ~~LOC~~ SOLN
65.0000 mg | SUBCUTANEOUS | Status: DC
  Administered 2020-07-07 – 2020-07-08 (×2): 65 mg via SUBCUTANEOUS
  Filled 2020-07-07 (×2): qty 0.8

## 2020-07-07 NOTE — Progress Notes (Addendum)
° °  Subjective:  Scott Logan is a 24 y.o. with admit for necrotizing fasciitis of buttock on hospital day 3  [name] was examined and evaluated at bedside this am. He states he feels well and pain is well controlled. States he has been passing gas and urinating well. Denies any fevers, chills overnight.  Objective:  Vital signs in last 24 hours: Vitals:   07/06/20 1450 07/06/20 2030 07/07/20 0237 07/07/20 0356  BP: 132/87 (!) 150/94 (!) 146/95 135/86  Pulse: 100 (!) 108 93 90  Resp: 19 17 18 17   Temp: 98.5 F (36.9 C) 99.7 F (37.6 C) 99 F (37.2 C) 99.3 F (37.4 C)  TempSrc: Oral Oral Oral Oral  SpO2: 96% 97% 96% 93%  Weight:      Height:       Gen: Well-developed, well nourished, NAD Pulm: Breathing comfortably on room air, no cough, no distress  Extm: ROM intact, No peripheral edema GU: Foley in place Skin: Dry, Warm, normal turgor, buttock operative site covered w/ fresh bandaging  Assessment/Plan:  Principal Problem:   Necrotizing fasciitis of left buttock (HCC) Active Problems:   Diabetes (HCC)  [name] is a 24 yo M admit for perineal NSTI s/p I&D 8/12 w/ repeat washout 8/15.   Necrotizing fasciitis s/p I&D 8/12 Tolerating return to OR well yesterday. No further purulence per OP note. Appear to have no further surgical intervention planned per surgery. Getting PT hydrotherapy. Currently on cefepime / vanc on day 4. Culture growing staph aureus. Sensitivities pending. Can narrow further and transition to oral once sensitivities result. Will switch to oral pain regimen as well as he can tolerate diet - Appreciate surgery recs: Hydrotherapy, Dressing changes - Cefepime, vanc (8/12-) - Oxycodone IR 5-10mg  PRN - Dilaudid 0.5-1mg  q4 PRN only for breakthrough - Switch to lovenox for VTE prophylaxis  Newly diagnosed diabetes mellitus On arrival, A1c 13.1. Am cbg 210 (goal <180) Will continue to titrate insulin to promote wound healing. Plan to increase Lantus,  Novolog, and add meal time coverage. Patient will need to follow-up with primary care for further medication management. [facility]. -CBGs -Increase Lantus to 40u QHS -Increase Novolog 15u TID QC -SSI  Post-op anemia Noted to have decline in hemoglobin after multiple visits to OR for I&D & Washout. Will check iron stores to assist in recovery.  - Iron panel - Trend cbc - Transfuse if <7  DVT prophx: Lovenox Diet: CM Bowel: Miralax Code: Full  Prior to Admission Living Arrangement: Home Anticipated Discharge Location: Home Barriers to Discharge: Medical treatment Dispo: Anticipated discharge in approximately 1-2 day(s).   01-20-2000, MD 07/07/2020, 10:13 AM Pager: [phone] After 5pm on weekdays and 1pm on weekends: On Call Pager: [phone]

## 2020-07-07 NOTE — Progress Notes (Addendum)
[facility] Office:  [phone] General Surgery Progress Note   LOS: 3 days  POD -  1 Day Post-Op  Assessment and Plan: 1.  DRESSING CHANGE UNDER ANESTHESIA WITH WASH-OUT OF PERINEUM - 8/14 Scott Logan  I&D of left perineum soft tissue necrosis - 8/12 Scott Logan  Cefepime/Vancomycin  Start BID dressing changes and hydrotherapy 2.  DM  HgbA1C - 13.1 - 07/04/2020 3.  Morbid obesity   BMI - 40.6 4.  DVT prophylaxis - on SQ heparin 5.  Has foley in place   Principal Problem:   Necrotizing fasciitis of left buttock (HCC) Active Problems:   Diabetes (HCC)  Subjective:  Doing well.  Not too much pain.  Objective:   Vitals:   07/07/20 0237 07/07/20 0356  BP: (!) 146/95 135/86  Pulse: 93 90  Resp: 18 17  Temp: 99 F (37.2 C) 99.3 F (37.4 C)  SpO2: 96% 93%     Intake/Output from previous day:  08/14 0701 - 08/15 0700 In: 1804.9 [P.O.:477; I.V.:858; IV Piggyback:469.9] Out: 1530 [Urine:1500; Blood:30]  Intake/Output this shift:  No intake/output data recorded.   Physical Exam:   General: Obese male who is alert and oriented.    Wound: Perineal packing removed and the wound is generally clean.  To start regular dressing changes.     Lab Results:    Recent Labs    07/05/20 0258 07/06/20 0404  WBC 14.5* 10.4  HGB 12.2* 11.5*  HCT 38.3* 36.4*  PLT 253 269    BMET   Recent Labs    07/05/20 0258  NA 136  K 4.2  CL 105  CO2 21*  GLUCOSE 224*  BUN 7  CREATININE 0.77  CALCIUM 8.6*    PT/INR  No results for input(s): LABPROT, INR in the last 72 hours.  ABG  No results for input(s): PHART, HCO3 in the last 72 hours.  Invalid input(s): PCO2, PO2   Studies/Results:  No results found.   Anti-infectives:   Anti-infectives (From admission, onward)   Start     Dose/Rate Route Frequency Ordered Stop   07/04/20 1700  vancomycin (VANCOREADY) IVPB 1250 mg/250 mL     Discontinue     1,250 mg 166.7 mL/hr over 90 Minutes Intravenous Every 8 hours  07/04/20 0736     07/04/20 1600  ceFEPIme (MAXIPIME) 2 g in sodium chloride 0.9 % 100 mL IVPB     Discontinue     2 g 200 mL/hr over 30 Minutes Intravenous Every 8 hours 07/04/20 0946     07/04/20 1300  clindamycin (CLEOCIN) IVPB 600 mg  Status:  Discontinued        600 mg 100 mL/hr over 30 Minutes Intravenous Every 8 hours 07/04/20 0942 07/06/20 0700   07/04/20 0730  vancomycin (VANCOREADY) IVPB 2000 mg/400 mL        2,000 mg 200 mL/hr over 120 Minutes Intravenous  Once 07/04/20 0720 07/04/20 1001   07/04/20 0715  ceFEPIme (MAXIPIME) 2 g in sodium chloride 0.9 % 100 mL IVPB        2 g 200 mL/hr over 30 Minutes Intravenous  Once 07/04/20 0700 07/04/20 0925   07/04/20 0715  metroNIDAZOLE (FLAGYL) IVPB 500 mg        500 mg 100 mL/hr over 60 Minutes Intravenous  Once 07/04/20 0700 07/04/20 0925   07/04/20 0445  clindamycin (CLEOCIN) IVPB 600 mg        600 mg 100 mL/hr over 30 Minutes Intravenous  Once  07/04/20 0442 07/04/20 0603      Ovidio Kin, MD, [facility] Surgery Office: [phone] 07/07/2020

## 2020-07-07 NOTE — Progress Notes (Signed)
Pharmacy Antibiotic Note  Scott Logan is a 24 y.o. male admitted on 07/03/2020 with necrotizing fasciitis of left buttock. Patient was initially started on vancomycin /cefepime /clindamycin, and clindamycin has been discontinued after ruling out Group A Strep. Patient is status post I&D on 8/12 and repeat washout on 8/14. Pharmacy has been consulted for vancomycin and cefepime dosing.   Patient is afebrile, WBC wnl. Vancomycin trough at steady state this AM is subtherapeutic at 11 mcg/mL (goal trough 15-20 mcg/mL). Renal function is stable.  Plan: Increase vancomycin to 1500 mg IV q8h Continue cefepime 2 gm IV q8h F/u abx narrowing once cultures finalize Monitor renal function, clinical improvement, and vanc levels as needed  Height: 6' (182.9 cm) Weight: 135.8 kg (299 lb 6.6 oz) IBW/kg (Calculated) : 77.6  Temp (24hrs), Avg:99.1 F (37.3 C), Min:98.5 F (36.9 C), Max:99.7 F (37.6 C)  Recent Labs  Lab 07/03/20 1908 07/05/20 0258 07/06/20 0404 07/07/20 0835  WBC 17.3* 14.5* 10.4  --   CREATININE 0.91 0.77  --   --   LATICACIDVEN 1.5  --   --   --   VANCOTROUGH  --   --   --  11*    Estimated Creatinine Clearance: 203.2 mL/min (by C-G formula based on SCr of 0.77 mg/dL).    No Known Allergies  Antimicrobials this admission: Vanc 8/12>> Cefepime 8/12>> Flagyl x 1 8/12 Clinda 8/12>8/14  Dose adjustments this admission: 8/15 VT = 11, increase dose from 1250 mg q8h to 1500 mg q8h  Microbiology results: 8/12 BCx: NGTD 8/12 wound Cx: abundant GBS 8/12 abscess: rare Staph aureus, susc. pending 8/14 MRSA PCR: negative  Thank you for allowing pharmacy to be a part of this patient's care.  Lulu Riding, PharmD PGY2 Pharmacy Resident Phone between 7 am - 3:30 pm: [phone]  Please check AMION for all [facility] Pharmacy phone numbers After 10:00 PM, call Main Pharmacy [phone]  07/07/2020 10:37 AM

## 2020-07-08 DIAGNOSIS — B9561 Methicillin susceptible Staphylococcus aureus infection as the cause of diseases classified elsewhere: Secondary | ICD-10-CM

## 2020-07-08 DIAGNOSIS — D6489 Other specified anemias: Secondary | ICD-10-CM

## 2020-07-08 LAB — CBC
HCT: 35 % — ABNORMAL LOW (ref 39.0–52.0)
Hemoglobin: 11.1 g/dL — ABNORMAL LOW (ref 13.0–17.0)
MCH: 25.4 pg — ABNORMAL LOW (ref 26.0–34.0)
MCHC: 31.7 g/dL (ref 30.0–36.0)
MCV: 80.1 fL (ref 80.0–100.0)
Platelets: 318 10*3/uL (ref 150–400)
RBC: 4.37 MIL/uL (ref 4.22–5.81)
RDW: 13.6 % (ref 11.5–15.5)
WBC: 9.1 10*3/uL (ref 4.0–10.5)
nRBC: 0 % (ref 0.0–0.2)

## 2020-07-08 LAB — GLUCOSE, CAPILLARY
Glucose-Capillary: 187 mg/dL — ABNORMAL HIGH (ref 70–99)
Glucose-Capillary: 110 mg/dL — ABNORMAL HIGH (ref 70–99)
Glucose-Capillary: 138 mg/dL — ABNORMAL HIGH (ref 70–99)
Glucose-Capillary: 156 mg/dL — ABNORMAL HIGH (ref 70–99)
Glucose-Capillary: 129 mg/dL — ABNORMAL HIGH (ref 70–99)
Glucose-Capillary: 169 mg/dL — ABNORMAL HIGH (ref 70–99)

## 2020-07-08 LAB — BASIC METABOLIC PANEL
Anion gap: 8 (ref 5–15)
BUN: <5 mg/dL — ABNORMAL LOW (ref 6–20)
CO2: 22 mmol/L (ref 22–32)
Calcium: 8.4 mg/dL — ABNORMAL LOW (ref 8.9–10.3)
Chloride: 107 mmol/L (ref 98–111)
Creatinine, Ser: 0.71 mg/dL (ref 0.61–1.24)
GFR calc Af Amer: >60 mL/min (ref >60)
GFR calc non Af Amer: >60 mL/min (ref >60)
Glucose, Bld: 128 mg/dL — ABNORMAL HIGH (ref 70–99)
Potassium: 3.7 mmol/L (ref 3.5–5.1)
Sodium: 137 mmol/L (ref 135–145)

## 2020-07-08 MED ORDER — CEPHALEXIN 500 MG PO CAPS
500.0000 mg | ORAL_CAPSULE | Freq: Two times a day (BID) | ORAL | Status: DC
  Administered 2020-07-08 – 2020-07-09 (×2): 500 mg via ORAL
  Filled 2020-07-08 (×3): qty 1

## 2020-07-08 MED ORDER — NYSTATIN 100000 UNIT/GM EX POWD
Freq: Three times a day (TID) | CUTANEOUS | Status: DC
  Filled 2020-07-08 (×2): qty 15

## 2020-07-08 NOTE — Plan of Care (Signed)
  Problem: Education: Goal: Required Educational Video(s) Outcome: Progressing   

## 2020-07-08 NOTE — Progress Notes (Addendum)
Physical Therapy Wound Treatment and Discharge Patient Details  Name: Scott Logan MRN: [id number] Date of Birth: 1996/03/12  Today's Date: 07/08/2020 Time: 0912-0956 Time Calculation (min): 44 min  Subjective  Subjective: Pt requests girlfriend be trained on dressing change Patient and Family Stated Goals: wound healing Date of Onset:  (unsure) Prior Treatments: surgical debridement  Pain Score: Pt described pressure with packing but not pain.    Wound Assessment  Wound / Incision (Open or Dehisced) 07/08/20 Incision - Open Thigh Left surgically debrided perineal wound from L inner thigh to buttock  (Active)  Wound Image   07/08/20 1100  Dressing Type ABD;Moist to moist;Normal saline moist dressing 07/08/20 1100  Dressing Changed Changed 07/08/20 1100  Dressing Status Clean;Dry;Intact 07/08/20 1100  Dressing Change Frequency Twice a day 07/08/20 1100  Site / Wound Assessment Pink;Red;Granulation tissue 07/08/20 1100  % Wound base Red or Granulating 100% 07/08/20 1100  % Wound base Yellow/Fibrinous Exudate 0% 07/08/20 1100  % Wound base Black/Eschar 0% 07/08/20 1100  % Wound base Other/Granulation Tissue (Comment) 0% 07/08/20 1100  Peri-wound Assessment Intact 07/08/20 1100  Wound Length (cm) 17 cm 07/08/20 1100  Wound Width (cm) 6 cm 07/08/20 1100  Wound Depth (cm) 3 cm 07/08/20 1100  Wound Volume (cm^3) 306 cm^3 07/08/20 1100  Wound Surface Area (cm^2) 102 cm^2 07/08/20 1100  Margins Unattached edges (unapproximated) 07/08/20 1100  Closure None 07/08/20 1100  Drainage Amount Minimal 07/08/20 1100  Drainage Description Serosanguineous 07/08/20 1100  Treatment Hydrotherapy (Pulse lavage);Packing (Saline gauze) 07/08/20 1100      Hydrotherapy Pulsed lavage therapy - wound location: L perineum Pulsed Lavage with Suction (psi): 12 psi Pulsed Lavage with Suction - Normal Saline Used: 1000 mL Pulsed Lavage Tip: Tip with splash shield   Wound Assessment and Plan  Wound  Therapy - Assess/Plan/Recommendations Wound Therapy - Clinical Statement: Undressed wound with surgical PA in room, agreed that pt would benefit from pulse lavage today, however wound bed is clean with healthy, granulation tissue that will not need further hydrotherapy treatment at this time. Spent time educating pt and girlfriend on dressing changes and wound healing process. Hydrotherapy PT signing off.   Wound Therapy - Functional Problem List: DM  Factors Delaying/Impairing Wound Healing: Diabetes Mellitus;Infection - systemic/local Hydrotherapy Plan: Other (comment) (Discharge) Wound Plan: Discharge  Wound Therapy Goals- Improve the function of patient's integumentary system by progressing the wound(s) through the phases of wound healing (inflammation - proliferation - remodeling) by: Patient/Family will be able to : girlfriend will be able to change dressing independently  Patient/Family Instruction Goal - Progress: Goal set today Goals/treatment plan/discharge plan were made with and agreed upon by patient/family: Yes Time For Goal Achievement: 2 days Wound Therapy - Potential for Goals: Good  Goals will be updated until maximal potential achieved or discharge criteria met.  Discharge criteria: when goals achieved, discharge from hospital, MD decision/surgical intervention, no progress towards goals, refusal/missing three consecutive treatments without notification or medical reason.  GP    Dani Gobble. Migdalia Dk PT, DPT Acute Rehabilitation Services Pager [phone] Office [phone]  [place] 07/08/2020, 12:14 PM

## 2020-07-08 NOTE — Plan of Care (Signed)
  Problem: Clinical Measurements: Goal: Postoperative complications will be avoided or minimized Outcome: Progressing   Problem: Skin Integrity: Goal: Demonstration of wound healing without infection will improve Outcome: Progressing   Problem: Education: Goal: Knowledge of General Education information will improve Description: Including pain rating scale, medication(s)/side effects and non-pharmacologic comfort measures Outcome: Progressing   Problem: Health Behavior/Discharge Planning: Goal: Ability to manage health-related needs will improve Outcome: Progressing   Problem: Clinical Measurements: Goal: Ability to maintain clinical measurements within normal limits will improve Outcome: Progressing Goal: Will remain free from infection Outcome: Progressing Goal: Diagnostic test results will improve Outcome: Progressing Goal: Cardiovascular complication will be avoided Outcome: Progressing   Problem: Activity: Goal: Risk for activity intolerance will decrease Outcome: Progressing   Problem: Nutrition: Goal: Adequate nutrition will be maintained Outcome: Progressing   Problem: Coping: Goal: Level of anxiety will decrease Outcome: Progressing   Problem: Elimination: Goal: Will not experience complications related to urinary retention Outcome: Progressing   Problem: Pain Managment: Goal: General experience of comfort will improve Outcome: Progressing   Problem: Safety: Goal: Ability to remain free from injury will improve Outcome: Progressing   Problem: Skin Integrity: Goal: Risk for impaired skin integrity will decrease Outcome: Progressing

## 2020-07-08 NOTE — Progress Notes (Signed)
° °  Subjective:  Patient seen at bedside this AM. Reports he is doing well, pain is well-controlled. States he's tolerating food well, urinating, having BM's. No complaints at this time.  Objective:  Vital signs in last 24 hours: Vitals:   07/07/20 1100 07/07/20 1449 07/07/20 1952 07/08/20 0440  BP: (!) 141/89 118/80 120/79 119/81  Pulse: 94 85 86 87  Resp: 18 18 18 18   Temp: 97.6 F (36.4 C) 98.7 F (37.1 C) 98.5 F (36.9 C) 98.2 F (36.8 C)  TempSrc: Axillary Oral Oral Oral  SpO2: 95% 97% 98% 98%  Weight:      Height:       Physical Exam: General: Patient ambulating in room, no acute distress CV: Regular rate rhythm, no murmurs/gallops/rubs Pulm: CTAB, no wheezing, rales Abdomen: Soft, non-tender, non-distended Neuro: AAOx3, no focal deficits  Assessment/Plan:  Principal Problem:   Necrotizing fasciitis of left buttock (HCC) Active Problems:   Diabetes [facility])  Patient is 24yo male without prior medical history admitted for perineal necrotizing fascitis s/p I&D 8/12, r/p washout 8/15, found to have diabetes mellitus.  #Necrotizing fasciitis s/p I&D 8/12 Patient doing well POD4 s/p I&D, continues to be afebrile, HDS. Patient underwent hydrotherapy this AM, wound appears clean, no further debridement necessary per surgery. Minimal yeast around wound, Nystatin TID. WCx growing staph aureus, was on IV abx, transitioning to oral today. He is able to tolerate diet, on oral pain regimen. Appreciate surgery recs. -d/c cefepime, vanc -Keflex 500mg  BID x5d -Nystatin TID -Oxycodone 5-10mg  PRN -Dilaudid 0.1-1mg  q4 PRN breakthrough  #Newly diagnosed DM On arrival, A1c 13.1. AM CBG 110. Current insulin doses appear to be appropriate, keeping CBG<180. Patient to [facility] with [facility] for further treatment, management. -CBGs -Lantus 40u QHS -Novolog 15u TID QC -SSI  #Inflammatory Anemia Hgb 11.1 < 11.9 < 11.5. MCV 80. Iron studies yesterday revealed low Fe, TIBC, saturation  with increased ferritin. Likely anemia 2/2 inflammation regarding patient's infection. Will continue to trend CBC, okay to transfuse if <7. -Trend H/H -Transfuse if <7  Diet: HH, CM IVF: n/a Bowel: Miralax DVT PPX: Lovenox Prior to Admission Living Arrangement: home Anticipated Discharge Location: home Barriers to Discharge: medical treatment Dispo: Anticipated discharge in approximately 1-2 day(s).   10/12, MD 07/08/2020, 5:24 AM Pager: [phone] After 5pm on weekdays and 1pm on weekends: On Call pager [phone]

## 2020-07-08 NOTE — Social Work (Signed)
CSW has emailed [employer] with [employer] to evaluate eligibility for Medicaid. This has been communicated to MD team Dr. Marijo Conception.   Octavio Graves, MSW, LCSW [facility] Clinical Social Work

## 2020-07-08 NOTE — Progress Notes (Signed)
   Wound is 100% beefy red granulation tissue with no further infection.  Does not need further hydrotherapy.  Does have some yeast around his wound.  Will add Nystatin powder TID. Given he is AF, normal WBC, and clean wound, can transition from IV abx to oral abx.  Can likely treat with 10 days total given he is a DM, otherwise no further infection noted.  Letha Cape 9:32 AM 07/08/2020

## 2020-07-08 NOTE — Progress Notes (Signed)
2 Days Post-Op  Subjective: Up walking around his room with no complaints.  WBC normal and AF.  Voiding well on his own.  PT hydrotherapy to start today.  ROS: See above, otherwise other systems negative  Objective: Vital signs in last 24 hours: Temp:  [97.6 F (36.4 C)-98.7 F (37.1 C)] 98.2 F (36.8 C) (08/16 0440) Pulse Rate:  [85-94] 87 (08/16 0440) Resp:  [18] 18 (08/16 0440) BP: (118-141)/(79-89) 119/81 (08/16 0440) SpO2:  [95 %-98 %] 98 % (08/16 0440) Last BM Date: 07/05/20  Intake/Output from previous day: 08/15 0701 - 08/16 0700 In: 825.4 [P.O.:450; IV Piggyback:375.4] Out: 475 [Urine:475] Intake/Output this shift: No intake/output data recorded.  PE: Skin: dressing in place currently.  Will eval wound with hydrotherapy later today  Lab Results:  Recent Labs    07/07/20 2043 07/08/20 0656  WBC 8.6 9.1  HGB 11.9* 11.1*  HCT 37.9* 35.0*  PLT 336 318   BMET Recent Labs    07/07/20 2043 07/08/20 0656  NA 137 137  K 3.7 3.7  CL 105 107  CO2 23 22  GLUCOSE 159* 128*  BUN 7 <5*  CREATININE 0.62 0.71  CALCIUM 8.8* 8.4*   PT/INR No results for input(s): LABPROT, INR in the last 72 hours. CMP     Component Value Date/Time   NA 137 07/08/2020 0656   K 3.7 07/08/2020 0656   CL 107 07/08/2020 0656   CO2 22 07/08/2020 0656   GLUCOSE 128 (H) 07/08/2020 0656   BUN <5 (L) 07/08/2020 0656   CREATININE 0.71 07/08/2020 0656   CALCIUM 8.4 (L) 07/08/2020 0656   PROT 7.9 07/03/2020 1908   ALBUMIN 3.1 (L) 07/03/2020 1908   AST 15 07/03/2020 1908   ALT 26 07/03/2020 1908   ALKPHOS 59 07/03/2020 1908   BILITOT 0.7 07/03/2020 1908   GFRNONAA >60 07/08/2020 0656   GFRAA >60 07/08/2020 0656   Lipase  No results found for: LIPASE     Studies/Results: No results found.  Anti-infectives: Anti-infectives (From admission, onward)   Start     Dose/Rate Route Frequency Ordered Stop   07/07/20 1700  vancomycin (VANCOREADY) IVPB 1500 mg/300 mL      Discontinue     1,500 mg 150 mL/hr over 120 Minutes Intravenous Every 8 hours 07/07/20 1056     07/04/20 1700  vancomycin (VANCOREADY) IVPB 1250 mg/250 mL  Status:  Discontinued        1,250 mg 166.7 mL/hr over 90 Minutes Intravenous Every 8 hours 07/04/20 0736 07/07/20 1056   07/04/20 1600  ceFEPIme (MAXIPIME) 2 g in sodium chloride 0.9 % 100 mL IVPB     Discontinue     2 g 200 mL/hr over 30 Minutes Intravenous Every 8 hours 07/04/20 0946     07/04/20 1300  clindamycin (CLEOCIN) IVPB 600 mg  Status:  Discontinued        600 mg 100 mL/hr over 30 Minutes Intravenous Every 8 hours 07/04/20 0942 07/06/20 0700   07/04/20 0730  vancomycin (VANCOREADY) IVPB 2000 mg/400 mL        2,000 mg 200 mL/hr over 120 Minutes Intravenous  Once 07/04/20 0720 07/04/20 1001   07/04/20 0715  ceFEPIme (MAXIPIME) 2 g in sodium chloride 0.9 % 100 mL IVPB        2 g 200 mL/hr over 30 Minutes Intravenous  Once 07/04/20 0700 07/04/20 0925   07/04/20 0715  metroNIDAZOLE (FLAGYL) IVPB 500 mg  500 mg 100 mL/hr over 60 Minutes Intravenous  Once 07/04/20 0700 07/04/20 0925   07/04/20 0445  clindamycin (CLEOCIN) IVPB 600 mg        600 mg 100 mL/hr over 30 Minutes Intravenous  Once 07/04/20 0442 07/04/20 0603       Assessment/Plan New onset DM  POD 4,2 debridement perineal NSTI- Hollice Espy -cx with MSSA.  Agree with narrowing abx therapy.  WBC normal and AF.  Can likely start to transition to oral abx therapy, but would like to see wound today with hydrotherapy to assure no further infection or need for further debridement.  D/W primary team -cont dressing changes BID for now.  FEN - carb mod diet VTE - lovenox ID - maxipime/vanc   LOS: 4 days    Letha Cape , [facility] 07/08/2020, 8:28 AM Please see Amion for pager number during day hours 7:00am-4:30pm or 7:00am -11:30am on weekends

## 2020-07-09 ENCOUNTER — Telehealth: Payer: Self-pay

## 2020-07-09 LAB — CBC
HCT: 36.1 % — ABNORMAL LOW (ref 39.0–52.0)
Hemoglobin: 11.5 g/dL — ABNORMAL LOW (ref 13.0–17.0)
MCH: 25.6 pg — ABNORMAL LOW (ref 26.0–34.0)
MCHC: 31.9 g/dL (ref 30.0–36.0)
MCV: 80.2 fL (ref 80.0–100.0)
Platelets: 342 10*3/uL (ref 150–400)
RBC: 4.5 MIL/uL (ref 4.22–5.81)
RDW: 13.8 % (ref 11.5–15.5)
WBC: 9.9 10*3/uL (ref 4.0–10.5)
nRBC: 0 % (ref 0.0–0.2)

## 2020-07-09 LAB — BASIC METABOLIC PANEL
Anion gap: 10 (ref 5–15)
BUN: 5 mg/dL — ABNORMAL LOW (ref 6–20)
CO2: 23 mmol/L (ref 22–32)
Calcium: 8.7 mg/dL — ABNORMAL LOW (ref 8.9–10.3)
Chloride: 104 mmol/L (ref 98–111)
Creatinine, Ser: 0.69 mg/dL (ref 0.61–1.24)
GFR calc Af Amer: >60 mL/min (ref >60)
GFR calc non Af Amer: >60 mL/min (ref >60)
Glucose, Bld: 140 mg/dL — ABNORMAL HIGH (ref 70–99)
Potassium: 3.8 mmol/L (ref 3.5–5.1)
Sodium: 137 mmol/L (ref 135–145)

## 2020-07-09 LAB — CULTURE, BLOOD (ROUTINE X 2)
Culture: NO GROWTH
Culture: NO GROWTH
Special Requests: ADEQUATE

## 2020-07-09 LAB — GLUCOSE, CAPILLARY
Glucose-Capillary: 151 mg/dL — ABNORMAL HIGH (ref 70–99)
Glucose-Capillary: 142 mg/dL — ABNORMAL HIGH (ref 70–99)

## 2020-07-09 LAB — AEROBIC/ANAEROBIC CULTURE W GRAM STAIN (SURGICAL/DEEP WOUND): Gram Stain: NONE SEEN

## 2020-07-09 MED ORDER — ACETAMINOPHEN 500 MG PO TABS: 1000.0000 mg | ORAL_TABLET | Freq: Four times a day (QID) | ORAL | Status: DC | PRN

## 2020-07-09 MED ORDER — INSULIN ASPART 100 UNIT/ML FLEXPEN
15.0000 [IU] | PEN_INJECTOR | Freq: Three times a day (TID) | SUBCUTANEOUS | 0 refills | Status: DC
Start: 2020-07-09 — End: 2020-08-07

## 2020-07-09 MED ORDER — OXYCODONE HCL 5 MG PO TABS: 5.0000 mg | ORAL_TABLET | Freq: Four times a day (QID) | ORAL | 0 refills | Status: DC | PRN

## 2020-07-09 MED ORDER — INSULIN GLARGINE 100 UNIT/ML ~~LOC~~ SOLN: 40.0000 [IU] | Freq: Every day | SUBCUTANEOUS | 0 refills | Status: DC

## 2020-07-09 MED ORDER — BLOOD GLUCOSE MONITOR KIT: PACK | 0 refills | Status: DC

## 2020-07-09 MED ORDER — PEN NEEDLES 32G X 6 MM MISC: 100.0000 | Freq: Three times a day (TID) | 0 refills | Status: DC

## 2020-07-09 MED ORDER — CEPHALEXIN 500 MG PO CAPS: 500.0000 mg | ORAL_CAPSULE | Freq: Two times a day (BID) | ORAL | 0 refills | Status: AC

## 2020-07-09 MED ORDER — LIVING WELL WITH DIABETES BOOK
Freq: Once | Status: AC
  Filled 2020-07-09: qty 1

## 2020-07-09 MED ORDER — NYSTATIN 100000 UNIT/GM EX POWD: Freq: Three times a day (TID) | CUTANEOUS | 0 refills | Status: DC

## 2020-07-09 MED FILL — NYSTATIN 100,000 UNIT/GM PO: 100000 | 7 days supply | Qty: 15 | Fill #0

## 2020-07-09 MED FILL — TRUE METRIX BLOOD GLUCOSE M: W/DEVICE | 1 days supply | Qty: 1 | Fill #0

## 2020-07-09 MED FILL — LANTUS SOLOSTAR 100 UNITS/M: 100 | 30 days supply | Qty: 12 | Fill #0

## 2020-07-09 MED FILL — NOVOLOG FLEXPEN SYRINGE: 100 | 24 days supply | Qty: 12 | Fill #0

## 2020-07-09 MED FILL — oxyCODONE HCL 5 MG TABS: 5 | 3 days supply | Qty: 20 | Fill #0

## 2020-07-09 MED FILL — TRUEplus LANCETS 28G MISC: 25 days supply | Qty: 100 | Fill #0

## 2020-07-09 MED FILL — PENTIPS 32G X 4 MM MISC: 32G X 4 MM | 30 days supply | Qty: 100 | Fill #0

## 2020-07-09 MED FILL — CEPHALEXIN 500 MG CAPS: 500 | 4 days supply | Qty: 9 | Fill #0

## 2020-07-09 MED FILL — TRUE METRIX GLUCOSE TEST ST: 25 days supply | Qty: 100 | Fill #0

## 2020-07-09 NOTE — Progress Notes (Signed)
3 Days Post-Op  Subjective: No new complaints today.  Doing well.  Some questions about diet changes and what to eat moving forward.  We discussed wound care at home and answered questions regarding this as well.  ROS: See above, otherwise other systems negative  Objective: Vital signs in last 24 hours: Temp:  [97.8 F (36.6 C)-99.5 F (37.5 C)] 97.8 F (36.6 C) (08/17 0504) Pulse Rate:  [76-91] 76 (08/17 0504) Resp:  [18] 18 (08/17 0504) BP: (134-147)/(78-86) 134/78 (08/17 0504) SpO2:  [94 %-98 %] 98 % (08/17 0504) Last BM Date: 07/01/20  Intake/Output from previous day: 08/16 0701 - 08/17 0700 In: 220 [P.O.:220] Out: -  Intake/Output this shift: No intake/output data recorded.  PE: Skin: yeast improving some, but still present.  Wound is stable and very clean with good granulation tissue.  No further evidence of infection.  Lab Results:  Recent Labs    07/07/20 2043 07/08/20 0656  WBC 8.6 9.1  HGB 11.9* 11.1*  HCT 37.9* 35.0*  PLT 336 318   BMET Recent Labs    07/07/20 2043 07/08/20 0656  NA 137 137  K 3.7 3.7  CL 105 107  CO2 23 22  GLUCOSE 159* 128*  BUN 7 <5*  CREATININE 0.62 0.71  CALCIUM 8.8* 8.4*   PT/INR No results for input(s): LABPROT, INR in the last 72 hours. CMP     Component Value Date/Time   NA 137 07/08/2020 0656   K 3.7 07/08/2020 0656   CL 107 07/08/2020 0656   CO2 22 07/08/2020 0656   GLUCOSE 128 (H) 07/08/2020 0656   BUN <5 (L) 07/08/2020 0656   CREATININE 0.71 07/08/2020 0656   CALCIUM 8.4 (L) 07/08/2020 0656   PROT 7.9 07/03/2020 1908   ALBUMIN 3.1 (L) 07/03/2020 1908   AST 15 07/03/2020 1908   ALT 26 07/03/2020 1908   ALKPHOS 59 07/03/2020 1908   BILITOT 0.7 07/03/2020 1908   GFRNONAA >60 07/08/2020 0656   GFRAA >60 07/08/2020 0656   Lipase  No results found for: LIPASE     Studies/Results: No results found.  Anti-infectives: Anti-infectives (From admission, onward)   Start     Dose/Rate Route Frequency  Ordered Stop   07/08/20 1800  cephALEXin (KEFLEX) capsule 500 mg     Discontinue     500 mg Oral Every 12 hours 07/08/20 1112 07/14/20 0959   07/07/20 1700  vancomycin (VANCOREADY) IVPB 1500 mg/300 mL  Status:  Discontinued        1,500 mg 150 mL/hr over 120 Minutes Intravenous Every 8 hours 07/07/20 1056 07/08/20 1113   07/04/20 1700  vancomycin (VANCOREADY) IVPB 1250 mg/250 mL  Status:  Discontinued        1,250 mg 166.7 mL/hr over 90 Minutes Intravenous Every 8 hours 07/04/20 0736 07/07/20 1056   07/04/20 1600  ceFEPIme (MAXIPIME) 2 g in sodium chloride 0.9 % 100 mL IVPB  Status:  Discontinued        2 g 200 mL/hr over 30 Minutes Intravenous Every 8 hours 07/04/20 0946 07/08/20 1113   07/04/20 1300  clindamycin (CLEOCIN) IVPB 600 mg  Status:  Discontinued        600 mg 100 mL/hr over 30 Minutes Intravenous Every 8 hours 07/04/20 0942 07/06/20 0700   07/04/20 0730  vancomycin (VANCOREADY) IVPB 2000 mg/400 mL        2,000 mg 200 mL/hr over 120 Minutes Intravenous  Once 07/04/20 0720 07/04/20 1001   07/04/20 0715  ceFEPIme (MAXIPIME) 2 g in sodium chloride 0.9 % 100 mL IVPB        2 g 200 mL/hr over 30 Minutes Intravenous  Once 07/04/20 0700 07/04/20 0925   07/04/20 0715  metroNIDAZOLE (FLAGYL) IVPB 500 mg        500 mg 100 mL/hr over 60 Minutes Intravenous  Once 07/04/20 0700 07/04/20 0925   07/04/20 0445  clindamycin (CLEOCIN) IVPB 600 mg        600 mg 100 mL/hr over 30 Minutes Intravenous  Once 07/04/20 0442 07/04/20 0603       Assessment/Plan New onset DM - have ordered a dietitian consult, CBG video, and DM booklet for patient for education as he has a lot of questions about these type of things.  POD 5,3 debridement perineal NSTI- Hollice Espy -cx with MSSA.  Agree with narrowing abx therapy, on keflex.  Would recommend about 10 days total -narcotic and nystatin powder sent to pharmacy already -will arrange outpatient follow up for the patient to see [place] in about 2-3  weeks. -discussed wound care with the patient and his mother who seem comfortable with this. -he is surgically stable for DC home from our standpoint.  FEN - carb mod diet VTE - lovenox ID - Keflex Follow-up - outpatient wound care Dr. Mikey Bussing, Dr. [name] as needed   LOS: 5 days    Letha Cape , [facility] 07/09/2020, 8:18 AM Please see Amion for pager number during day hours 7:00am-4:30pm or 7:00am -11:30am on weekends

## 2020-07-09 NOTE — Progress Notes (Signed)
   Subjective:  Patient doing well this AM. Says he is feeling good and ready to go home. Reports family member learned how to changed dressing. Discussed blood sugar control, follow-up with IMTS.  Objective:  Vital signs in last 24 hours: Vitals:   07/08/20 0440 07/08/20 1347 07/08/20 1957 07/09/20 0504  BP: 119/81 139/86 (!) 147/82 134/78  Pulse: 87 80 91 76  Resp: 18 18 18 18   Temp: 98.2 F (36.8 C) 98.6 F (37 C) 99.5 F (37.5 C) 97.8 F (36.6 C)  TempSrc: Oral Oral Oral Oral  SpO2: 98% 98% 94% 98%  Weight:      Height:       Physical Exam: General: Well-appearing, sitting on side of bed, no acute distress. CV: Regular rate, rhythm. No murmurs, gallops, rubs. Pulm: Clear to auscultation bilaterally. Abdomen: Soft, non-tender, non-distended  Assessment/Plan:  Principal Problem:   Necrotizing fasciitis of left buttock [facility]) Active Problems:   Diabetes [facility])  Patient is 24yo male without prior medical history admitted for perineal necrotizing fasciitis s/p I&D 8/12, r/p washout 8/15, found to have diabetes mellitus.  #Necrotizing fasciitis s/p I&D 8/12 POD5 s/p I&D, washout on 8/15. Patient doing well clinically, remaining afebrile and HDS. Pain well-controlled on oral regimen. PT hydrotherapy yesterday with clean margins and granulation tissue, no signs of SSI, no further debridement necessary. Yeast present around wound, giving Nystatin TID. WCx grew MSSA, transitioned to po keflex yesterday. Patient will take po keflex through Saturday, totaling 10d abx. He is tolerating diet, urinating, and defecating appropriately. He is stable for dc. -po keflex (through 07/13/20) -Nystatin TID -Oxycodone PRN -Tylenol PRN  #Newly diagnosed DM AM CBG  151. Will send patient out on similar insulin regimen. Discussed with patient importance of adhering to insulin regimen and diet. Diabetes educator discussed with patient how to inject insulin and diet recommendations. Will follow-up  with the IMTS clinic this Friday, August 20th for further medication adjustments as needed. -Lantus 40u -Novolog 15u TID QC -SSI  Diet: HH, CM IVF: n/a Bowel: Miralax DVT PPX: Lovenox Code Status: Full Prior to Admission Living Arrangement: home Anticipated Discharge Location: home Barriers to Discharge: surgical clearance Dispo: Anticipated discharge in approximately 0 day(s).   August 22, MD 07/09/2020, 5:23 AM Pager: [phone] After 5pm on weekdays and 1pm on weekends: On Call pager [phone]

## 2020-07-09 NOTE — Discharge Instructions (Signed)
Diabetes Basics  Diabetes (diabetes mellitus) is a long-term (chronic) disease. It occurs when the body does not properly use sugar (glucose) that is released from food after you eat. Diabetes may be caused by one or both of these problems:  Your pancreas does not make enough of a hormone called insulin.  Your body does not react in a normal way to insulin that it makes. Insulin lets sugars (glucose) go into cells in your body. This gives you energy. If you have diabetes, sugars cannot get into cells. This causes high blood sugar (hyperglycemia). Follow these instructions at home: How is diabetes treated? You may need to take insulin or other diabetes medicines daily to keep your blood sugar in balance. Take your diabetes medicines every day as told by your doctor. List your diabetes medicines here: Diabetes medicines  Name of medicine: ______________________________ ? Amount (dose): _______________ Time (a.m./p.m.): _______________ Notes: ___________________________________  Name of medicine: ______________________________ ? Amount (dose): _______________ Time (a.m./p.m.): _______________ Notes: ___________________________________  Name of medicine: ______________________________ ? Amount (dose): _______________ Time (a.m./p.m.): _______________ Notes: ___________________________________ If you use insulin, you will learn how to give yourself insulin by injection. You may need to adjust the amount based on the food that you eat. List the types of insulin you use here: Insulin  Insulin type: ______________________________ ? Amount (dose): _______________ Time (a.m./p.m.): _______________ Notes: ___________________________________  Insulin type: ______________________________ ? Amount (dose): _______________ Time (a.m./p.m.): _______________ Notes: ___________________________________  Insulin type: ______________________________ ? Amount (dose): _______________ Time (a.m./p.m.):  _______________ Notes: ___________________________________  Insulin type: ______________________________ ? Amount (dose): _______________ Time (a.m./p.m.): _______________ Notes: ___________________________________  Insulin type: ______________________________ ? Amount (dose): _______________ Time (a.m./p.m.): _______________ Notes: ___________________________________ How do I manage my blood sugar?  Check your blood sugar levels using a blood glucose monitor as directed by your doctor. Your doctor will set treatment goals for you. Generally, you should have these blood sugar levels:  Before meals (preprandial): 80-130 mg/dL (4.4-7.2 mmol/L).  After meals (postprandial): below 180 mg/dL (10 mmol/L).  A1c level: less than 7%. Write down the times that you will check your blood sugar levels: Blood sugar checks  Time: _______________ Notes: ___________________________________  Time: _______________ Notes: ___________________________________  Time: _______________ Notes: ___________________________________  Time: _______________ Notes: ___________________________________  Time: _______________ Notes: ___________________________________  Time: _______________ Notes: ___________________________________  What do I need to know about low blood sugar? Low blood sugar is called hypoglycemia. This is when blood sugar is at or below 70 mg/dL (3.9 mmol/L). Symptoms may include:  Feeling: ? Hungry. ? Worried or nervous (anxious). ? Sweaty and clammy. ? Confused. ? Dizzy. ? Sleepy. ? Sick to your stomach (nauseous).  Having: ? A fast heartbeat. ? A headache. ? A change in your vision. ? Tingling or no feeling (numbness) around the mouth, lips, or tongue. ? Jerky movements that you cannot control (seizure).  Having trouble with: ? Moving (coordination). ? Sleeping. ? Passing out (fainting). ? Getting upset easily (irritability). Treating low blood sugar To treat low blood  sugar, eat or drink something sugary right away. If you can think clearly and swallow safely, follow the 15:15 rule:  Take 15 grams of a fast-acting carb (carbohydrate). Talk with your doctor about how much you should take.  Some fast-acting carbs are: ? Sugar tablets (glucose pills). Take 3-4 glucose pills. ? 6-8 pieces of hard candy. ? 4-6 oz (120-150 mL) of fruit juice. ? 4-6 oz (120-150 mL) of regular (not diet) soda. ? 1 Tbsp (15 mL) honey or sugar.  Check your blood sugar 15 minutes after you take the carb.  If your blood sugar is still at or below 70 mg/dL (3.9 mmol/L), take 15 grams of a carb again.  If your blood sugar does not go above 70 mg/dL (3.9 mmol/L) after 3 tries, get help right away.  After your blood sugar goes back to normal, eat a meal or a snack within 1 hour. Treating very low blood sugar If your blood sugar is at or below 54 mg/dL (3 mmol/L), you have very low blood sugar (severe hypoglycemia). This is an emergency. Do not wait to see if the symptoms will go away. Get medical help right away. Call your local emergency services (911 in the [place] Do not drive yourself to the hospital. Questions to ask your health care provider  Do I need to meet with a diabetes educator?  What equipment will I need to care for myself at home?  What diabetes medicines do I need? When should I take them?  How often do I need to check my blood sugar?  What number can I call if I have questions?  When is my next doctor's visit?  Where can I find a support group for people with diabetes? Where to find more information  American Diabetes Association: www.diabetes.org  American Association of Diabetes Educators: www.diabeteseducator.org/patient-resources Contact a doctor if:  Your blood sugar is at or above 240 mg/dL (81.4 mmol/L) for 2 days in a row.  You have been sick or have had a fever for 2 days or more, and you are not getting better.  You have any of these  problems for more than 6 hours: ? You cannot eat or drink. ? You feel sick to your stomach (nauseous). ? You throw up (vomit). ? You have watery poop (diarrhea). Get help right away if:  Your blood sugar is lower than 54 mg/dL (3 mmol/L).  You get confused.  You have trouble: ? Thinking clearly. ? Breathing. Summary  Diabetes (diabetes mellitus) is a long-term (chronic) disease. It occurs when the body does not properly use sugar (glucose) that is released from food after digestion.  Take insulin and diabetes medicines as told.  Check your blood sugar every day, as often as told.  Keep all follow-up visits as told by your doctor. This is important. This information is not intended to replace advice given to you by your health care provider. Make sure you discuss any questions you have with your health care provider. Document Revised: 08/02/2019 Document Reviewed: 02/11/2018 Elsevier Patient Education  2020 Elsevier Inc.    Diabetes Mellitus and Nutrition, Adult When you have diabetes (diabetes mellitus), it is very important to have healthy eating habits because your blood sugar (glucose) levels are greatly affected by what you eat and drink. Eating healthy foods in the appropriate amounts, at about the same times every day, can help you:  Control your blood glucose.  Lower your risk of heart disease.  Improve your blood pressure.  Reach or maintain a healthy weight. Every person with diabetes is different, and each person has different needs for a meal plan. Your health care provider may recommend that you work with a diet and nutrition specialist (dietitian) to make a meal plan that is best for you. Your meal plan may vary depending on factors such as:  The calories you need.  The medicines you take.  Your weight.  Your blood glucose, blood pressure, and cholesterol levels.  Your activity level.  Other  health conditions you have, such as heart or kidney  disease. How do carbohydrates affect me? Carbohydrates, also called carbs, affect your blood glucose level more than any other type of food. Eating carbs naturally raises the amount of glucose in your blood. Carb counting is a method for keeping track of how many carbs you eat. Counting carbs is important to keep your blood glucose at a healthy level, especially if you use insulin or take certain oral diabetes medicines. It is important to know how many carbs you can safely have in each meal. This is different for every person. Your dietitian can help you calculate how many carbs you should have at each meal and for each snack. Foods that contain carbs include:  Bread, cereal, rice, pasta, and crackers.  Potatoes and corn.  Peas, beans, and lentils.  Milk and yogurt.  Fruit and juice.  Desserts, such as cakes, cookies, ice cream, and candy. How does alcohol affect me? Alcohol can cause a sudden decrease in blood glucose (hypoglycemia), especially if you use insulin or take certain oral diabetes medicines. Hypoglycemia can be a life-threatening condition. Symptoms of hypoglycemia (sleepiness, dizziness, and confusion) are similar to symptoms of having too much alcohol. If your health care provider says that alcohol is safe for you, follow these guidelines:  Limit alcohol intake to no more than 1 drink per day for nonpregnant women and 2 drinks per day for men. One drink equals 12 oz of beer, 5 oz of wine, or 1 oz of hard liquor.  Do not drink on an empty stomach.  Keep yourself hydrated with water, diet soda, or unsweetened iced tea.  Keep in mind that regular soda, juice, and other mixers may contain a lot of sugar and must be counted as carbs. What are tips for following this plan?  Reading food labels  Start by checking the serving size on the "Nutrition Facts" label of packaged foods and drinks. The amount of calories, carbs, fats, and other nutrients listed on the label is based  on one serving of the item. Many items contain more than one serving per package.  Check the total grams (g) of carbs in one serving. You can calculate the number of servings of carbs in one serving by dividing the total carbs by 15. For example, if a food has 30 g of total carbs, it would be equal to 2 servings of carbs.  Check the number of grams (g) of saturated and trans fats in one serving. Choose foods that have low or no amount of these fats.  Check the number of milligrams (mg) of salt (sodium) in one serving. Most people should limit total sodium intake to less than 2,300 mg per day.  Always check the nutrition information of foods labeled as "low-fat" or "nonfat". These foods may be higher in added sugar or refined carbs and should be avoided.  Talk to your dietitian to identify your daily goals for nutrients listed on the label. Shopping  Avoid buying canned, premade, or processed foods. These foods tend to be high in fat, sodium, and added sugar.  Shop around the outside edge of the grocery store. This includes fresh fruits and vegetables, bulk grains, fresh meats, and fresh dairy. Cooking  Use low-heat cooking methods, such as baking, instead of high-heat cooking methods like deep frying.  Cook using healthy oils, such as olive, canola, or sunflower oil.  Avoid cooking with butter, cream, or high-fat meats. Meal planning  Eat meals and snacks  regularly, preferably at the same times every day. Avoid going long periods of time without eating.  Eat foods high in fiber, such as fresh fruits, vegetables, beans, and whole grains. Talk to your dietitian about how many servings of carbs you can eat at each meal.  Eat 4-6 ounces (oz) of lean protein each day, such as lean meat, chicken, fish, eggs, or tofu. One oz of lean protein is equal to: ? 1 oz of meat, chicken, or fish. ? 1 egg. ?  cup of tofu.  Eat some foods each day that contain healthy fats, such as avocado, nuts,  seeds, and fish. Lifestyle  Check your blood glucose regularly.  Exercise regularly as told by your health care provider. This may include: ? 150 minutes of moderate-intensity or vigorous-intensity exercise each week. This could be brisk walking, biking, or water aerobics. ? Stretching and doing strength exercises, such as yoga or weightlifting, at least 2 times a week.  Take medicines as told by your health care provider.  Do not use any products that contain nicotine or tobacco, such as cigarettes and e-cigarettes. If you need help quitting, ask your health care provider.  Work with a Veterinary surgeoncounselor or diabetes educator to identify strategies to manage stress and any emotional and social challenges. Questions to ask a health care provider  Do I need to meet with a diabetes educator?  Do I need to meet with a dietitian?  What number can I call if I have questions?  When are the best times to check my blood glucose? Where to find more information:  American Diabetes Association: diabetes.org  Academy of Nutrition and Dietetics: www.eatright.[employer]  National Institute of Diabetes and Digestive and Kidney Diseases (NIH): Car[employer].tnwww.niddk.nih.gov Summary  A healthy meal plan will help you control your blood glucose and maintain a healthy lifestyle.  Working with a diet and nutrition specialist (dietitian) can help you make a meal plan that is best for you.  Keep in mind that carbohydrates (carbs) and alcohol have immediate effects on your blood glucose levels. It is important to count carbs and to use alcohol carefully. This information is not intended to replace advice given to you by your health care provider. Make sure you discuss any questions you have with your health care provider. Document Revised: 10/22/2017 Document Reviewed: 12/14/2016 Elsevier Patient Education  2020 Elsevier Inc.  MIDLINE WOUND CARE: - midline dressing to be changed twice daily - supplies: sterile saline,  kerlix, scissors, ABD pads, tape  - remove dressing and all packing carefully, moistening with sterile saline as needed to avoid packing/internal dressing sticking to the wound. - clean edges of skin around the wound with water/gauze, making sure there is no tape debris or leakage left on skin that could cause skin irritation or breakdown. - dampen and clean kerlix with sterile saline and pack wound from wound base to skin level, making sure to take note of any possible areas of wound tracking, tunneling and packing appropriately. Wound can be packed loosely. Trim kerlix to size if a whole kerlix is not required. - cover wound with a dry ABD pad and secure with tape.  - write the date/time on the dry dressing/tape to better track when the last dressing change occurred. - apply any skin protectant/powder recommended by clinician to protect skin/skin folds. - change dressing as needed if leakage occurs, wound gets contaminated, or patient requests to shower. - patient may shower daily with wound open and following the shower the wound should  be dried and a clean dressing placed.   Carbohydrate Counting For People With Diabetes  Foods with carbohydrates make your blood glucose level go up. Learning how to count carbohydrates can help you control your blood glucose levels. First, identify the foods you eat that contain carbohydrates. Then, using the Foods with Carbohydrates chart, determine about how much carbohydrates are in your meals and snacks. Make sure you are eating foods with fiber, protein, and healthy fat along with your carbohydrate foods. Foods with Carbohydrates The following table shows carbohydrate foods that have about 15 grams of carbohydrate each. Using measuring cups, spoons, or a food scale when you first begin learning about carbohydrate counting can help you learn about the portion sizes you typically eat. The following foods have 15 grams carbohydrate each:  Grains . 1 slice bread  (1 ounce)  . 1 small tortilla (6-inch size)  .  large bagel (1 ounce)  . 1/3 cup pasta or rice (cooked)  .  hamburger or hot dog bun ( ounce)  .  cup cooked cereal  .  to  cup ready-to-eat cereal  . 2 taco shells (5-inch size) Fruit . 1 small fresh fruit ( to 1 cup)  .  medium banana  . 17 small grapes (3 ounces)  . 1 cup melon or berries  .  cup canned or frozen fruit  . 2 tablespoons dried fruit (blueberries, cherries, cranberries, raisins)  .  cup unsweetened fruit juice  Starchy Vegetables .  cup cooked beans, peas, corn, potatoes/sweet potatoes  .  large baked potato (3 ounces)  . 1 cup acorn or butternut squash  Snack Foods . 3 to 6 crackers  . 8 potato chips or 13 tortilla chips ( ounce to 1 ounce)  . 3 cups popped popcorn  Dairy . 3/4 cup (6 ounces) nonfat plain yogurt, or yogurt with sugar-free sweetener  . 1 cup milk  . 1 cup plain rice, soy, coconut or flavored almond milk Sweets and Desserts .  cup ice cream or frozen yogurt  . 1 tablespoon jam, jelly, pancake syrup, table sugar, or honey  . 2 tablespoons light pancake syrup  . 1 inch square of frosted cake or 2 inch square of unfrosted cake  . 2 small cookies (2/3 ounce each) or  large cookie  Sometimes you'll have to estimate carbohydrate amounts if you don't know the exact recipe. One cup of mixed foods like soups can have 1 to 2 carbohydrate servings, while some casseroles might have 2 or more servings of carbohydrate. Foods that have less than 20 calories in each serving can be counted as "free" foods. Count 1 cup raw vegetables, or  cup cooked non-starchy vegetables as "free" foods. If you eat 3 or more servings at one meal, then count them as 1 carbohydrate serving.  Foods without Carbohydrates  Not all foods contain carbohydrates. Meat, some dairy, fats, non-starchy vegetables, and many beverages don't contain carbohydrate. So when you count carbohydrates, you can generally exclude chicken, pork,  beef, fish, seafood, eggs, tofu, cheese, butter, sour cream, avocado, nuts, seeds, olives, mayonnaise, water, black coffee, unsweetened tea, and zero-calorie drinks. Vegetables with no or low carbohydrate include green beans, cauliflower, tomatoes, and onions. How much carbohydrate should I eat at each meal?  Carbohydrate counting can help you plan your meals and manage your weight. Following are some starting points for carbohydrate intake at each meal. Work with your registered dietitian nutritionist to find the best range that works for your  blood glucose and weight.   To Lose Weight To Maintain Weight  Women 2 - 3 carb servings 3 - 4 carb servings  Men 3 - 4 carb servings 4 - 5 carb servings  Checking your blood glucose after meals will help you know if you need to adjust the timing, type, or number of carbohydrate servings in your meal plan. Achieve and keep a healthy body weight by balancing your food intake and physical activity.  Tips How should I plan my meals?  Plan for half the food on your plate to include non-starchy vegetables, like salad greens, broccoli, or carrots. Try to eat 3 to 5 servings of non-starchy vegetables every day. Have a protein food at each meal. Protein foods include chicken, fish, meat, eggs, or beans (note that beans contain carbohydrate). These two food groups (non-starchy vegetables and proteins) are low in carbohydrate. If you fill up your plate with these foods, you will eat less carbohydrate but still fill up your stomach. Try to limit your carbohydrate portion to  of the plate.  What fats are healthiest to eat?  Diabetes increases risk for heart disease. To help protect your heart, eat more healthy fats, such as olive oil, nuts, and avocado. Eat less saturated fats like butter, cream, and high-fat meats, like bacon and sausage. Avoid trans fats, which are in all foods that list "partially hydrogenated oil" as an ingredient. What should I drink?  Choose drinks  that are not sweetened with sugar. The healthiest choices are water, carbonated or seltzer waters, and tea and coffee without added sugars.  Sweet drinks will make your blood glucose go up very quickly. One serving of soda or energy drink is  cup. It is best to drink these beverages only if your blood glucose is low.  Artificially sweetened, or diet drinks, typically do not increase your blood glucose if they have zero calories in them. Read labels of beverages, as some diet drinks do have carbohydrate and will raise your blood glucose. Label Reading Tips Read Nutrition Facts labels to find out how many grams of carbohydrate are in a food you want to eat. Don't forget: sometimes serving sizes on the label aren't the same as how much food you are going to eat, so you may need to calculate how much carbohydrate is in the food you are serving yourself.   Carbohydrate Counting for People with Diabetes Sample 1-Day Menu  Breakfast  cup yogurt, low fat, low sugar (1 carbohydrate serving)   cup cereal, ready-to-eat, unsweetened (1 carbohydrate serving)  1 cup strawberries (1 carbohydrate serving)   cup almonds ( carbohydrate serving)  Lunch 1, 5 ounce can chunk light tuna  2 ounces cheese, low fat cheddar  6 whole wheat crackers (1 carbohydrate serving)  1 small apple (1 carbohydrate servings)   cup carrots ( carbohydrate serving)   cup snap peas  1 cup 1% milk (1 carbohydrate serving)   Evening Meal Stir fry made with: 3 ounces chicken  1 cup brown rice (3 carbohydrate servings)   cup broccoli ( carbohydrate serving)   cup green beans   cup onions  1 tablespoon olive oil  2 tablespoons teriyaki sauce ( carbohydrate serving)  Evening Snack 1 extra small banana (1 carbohydrate serving)  1 tablespoon peanut butter   Carbohydrate Counting for People with Diabetes Vegan Sample 1-Day Menu  Breakfast 1 cup cooked oatmeal (2 carbohydrate servings)   cup blueberries (1 carbohydrate  serving)  2 tablespoons flaxseeds  1  cup soymilk fortified with calcium and vitamin D  1 cup coffee  Lunch 2 slices whole wheat bread (2 carbohydrate servings)   cup baked tofu   cup lettuce  2 slices tomato  2 slices avocado   cup baby carrots ( carbohydrate serving)  1 orange (1 carbohydrate serving)  1 cup soymilk fortified with calcium and vitamin D   Evening Meal Burrito made with: 1 6-inch corn tortilla (1 carbohydrate serving)  1 cup refried vegetarian beans (2 carbohydrate servings)   cup chopped tomatoes   cup lettuce   cup salsa  1/3 cup brown rice (1 carbohydrate serving)  1 tablespoon olive oil for rice   cup zucchini   Evening Snack 6 small whole grain crackers (1 carbohydrate serving)  2 apricots ( carbohydrate serving)   cup unsalted peanuts ( carbohydrate serving)    Carbohydrate Counting for People with Diabetes Vegetarian (Lacto-Ovo) Sample 1-Day Menu  Breakfast 1 cup cooked oatmeal (2 carbohydrate servings)   cup blueberries (1 carbohydrate serving)  2 tablespoons flaxseeds  1 egg  1 cup 1% milk (1 carbohydrate serving)  1 cup coffee  Lunch 2 slices whole wheat bread (2 carbohydrate servings)  2 ounces low-fat cheese   cup lettuce  2 slices tomato  2 slices avocado   cup baby carrots ( carbohydrate serving)  1 orange (1 carbohydrate serving)  1 cup unsweetened tea  Evening Meal Burrito made with: 1 6-inch corn tortilla (1 carbohydrate serving)   cup refried vegetarian beans (1 carbohydrate serving)   cup tomatoes   cup lettuce   cup salsa  1/3 cup brown rice (1 carbohydrate serving)  1 tablespoon olive oil for rice   cup zucchini  1 cup 1% milk (1 carbohydrate serving)  Evening Snack 6 small whole grain crackers (1 carbohydrate serving)  2 apricots ( carbohydrate serving)   cup unsalted peanuts ( carbohydrate serving)    Copyright 2020  Academy of Nutrition and Dietetics. All rights reserved.  Using Nutrition Labels:  Carbohydrate  . Serving Size  . Look at the serving size. All the information on the label is based on this portion. Jolyne Loa Per Container  . The number of servings contained in the package. . Guidelines for Carbohydrate  . Look at the total grams of carbohydrate in the serving size.  . 1 carbohydrate choice = 15 grams of carbohydrate. Range of Carbohydrate Grams Per Choice  Carbohydrate Grams/Choice Carbohydrate Choices  6-10   11-20 1  21-25 1  26-35 2  36-40 2  41-50 3  51-55 3  56-65 4  66-70 4  71-80 5    Copyright 2020  Academy of Nutrition and Dietetics. All rights reserved.

## 2020-07-09 NOTE — Progress Notes (Signed)
Scott Logan o be D/C'd per MD order. Discussed with the patient and all questions fully answered. ? VSS, Skin clean, dry and intact without evidence of skin break down, no evidence of skin tears noted. ? IV catheter discontinued intact. Site without signs and symptoms of complications. Dressing and pressure applied. ? An After Visit Summary was printed and given to the patient. Patient informed where to pickup prescriptions. ? D/c education completed with patient/family including follow up instructions, medication list, d/c activities limitations if indicated, with other d/c instructions as indicated by MD - patient able to verbalize understanding, all questions fully answered.  ? Patient instructed to return to ED, call 911, or call MD for any changes in condition.  ? Patient to be escorted via WC, and D/C home via private auto.

## 2020-07-09 NOTE — Discharge Summary (Addendum)
Name: Scott Logan MRN: [id number] DOB: Jan 02, 1996 24 y.o. PCP: System, Pcp Not In  Date of Admission: 07/03/2020  6:35 PM Date of Discharge: 07/09/20 Attending Physician: Aldine Contes, MD  Discharge Diagnosis: 1. Necrotizing fasciitis of perineum 2. Type 2 Diabetes Mellitus  Discharge Medications: Allergies as of 07/09/2020   No Known Allergies     Medication List    TAKE these medications   acetaminophen 500 MG tablet Commonly known as: TYLENOL Take 2 tablets (1,000 mg total) by mouth every 6 (six) hours as needed for mild pain, moderate pain, fever or headache.   blood glucose meter kit and supplies Kit Dispense based on patient and insurance preference. Use up to four times daily as directed. (FOR ICD-9 250.00, 250.01).   cephALEXin 500 MG capsule Commonly known as: KEFLEX Take 1 capsule (500 mg total) by mouth 2 (two) times daily for 9 doses.   cyclobenzaprine 10 MG tablet Commonly known as: FLEXERIL Take 1 tablet (10 mg total) by mouth 2 (two) times daily as needed for muscle spasms.   ibuprofen 600 MG tablet Commonly known as: ADVIL Take 1 tablet (600 mg total) by mouth every 6 (six) hours as needed.   insulin aspart 100 UNIT/ML FlexPen Commonly known as: NOVOLOG Inject 15 Units into the skin 3 (three) times daily with meals.   insulin glargine 100 UNIT/ML injection Commonly known as: LANTUS Inject 0.4 mLs (40 Units total) into the skin at bedtime.   nystatin powder Commonly known as: MYCOSTATIN/NYSTOP Apply topically 3 (three) times daily.   oxyCODONE 5 MG immediate release tablet Commonly known as: Oxy IR/ROXICODONE Take 1-2 tablets (5-10 mg total) by mouth every 6 (six) hours as needed for severe pain.   Pen Needles 32G X 6 MM Misc 100 each by Does not apply route 3 (three) times daily with meals.            Discharge Care Instructions  (From admission, onward)         Start     Ordered   07/09/20 0000  Discharge wound care:        Comments: - midline dressing to be changed twice daily - supplies: sterile saline, kerlix, scissors, ABD pads, tape  - remove dressing and all packing carefully, moistening with sterile saline as needed to avoid packing/internal dressing sticking to the wound. - clean edges of skin around the wound with water/gauze, making sure there is no tape debris or leakage left on skin that could cause skin irritation or breakdown. - dampen and clean kerlix with sterile saline and pack wound from wound base to skin level, making sure to take note of any possible areas of wound tracking, tunneling and packing appropriately. Wound can be packed loosely. Trim kerlix to size if a whole kerlix is not required. - cover wound with a dry ABD pad and secure with tape.  - write the date/time on the dry dressing/tape to better track when the last dressing change occurred. - apply any skin protectant/powder recommended by clinician to protect skin/skin folds. - change dressing as needed if leakage occurs, wound gets contaminated, or patient requests to shower. - patient may shower daily with wound open and following the shower the wound should be dried and a clean dressing placed.   07/09/20 1121          Disposition and follow-up:   Mr.Graviel U Frymire was discharged from [facility] in Good condition.  At the hospital follow up visit  please address:  1.  Patient admitted with necrotizing fasciitis of perineum, underwent debridement 8/12, washout on 8/15. Patient also diagnosed with diabetes, A1c 13. Started on Lantus 40, Novolog 15 TID QC.  2.  Labs / imaging needed at time of follow-up: CBC, BMP, wound check  3.  Pending labs/ test needing follow-up: n/a  Follow-up Appointments:    Follow-up Information    Kalman Shan Ratliff, DO Follow up in 3 week(s).   Specialty: Internal Medicine Why: Their office will call you with appointment and time Contact information: [address] [place] [address] [phone]        Rolm Bookbinder, MD Follow up.   Specialty: General Surgery Why: Call if needed.  we will have you follow up with the wound doctor's office.  You can call our office if you need [place] in the meantime. Contact information: [address] [place] [address] [place] [facility] [place]. Go on 07/12/2020.   Specialty: Internal Medicine Why: Your appointment is on Friday, August 20th at 3:30PM. Please arrive 15 minutes before your appointment time. If you have any questions, please feel free to call us.  Contact information: [address] [id number] [place] [facility] Course by problem list: 1. Necrotizing fasciitis: Patient presented febrile with leukocytosis, pain in the L buttock. CT revealed necrotizing fasciitis in the perineum, extending into base of scrotum. Patient started on empiric antibiotics, underwent I&D 5/46 without complications, followed by washout on 8/15. Wound cx grew MSSA. Patient clinically improved, HDS without fever and decreasing WBC. No SSI present, no further debridement necessaryTransitioned to po keflex, will have 10d of total antibiotics, follow-up with surgery outpatient.  2. Diabetes mellitus: Glucose 300, A1c 13 on arrival. Started patient on insulin during inpatient. Discussed new diagnoses with patient, along with diabetes educator. Will follow-up with PCP.  Discharge Vitals:   BP 134/78 (BP Location: Left Arm)   Pulse 76   Temp 97.8 F (36.6 C) (Oral)   Resp 18   Ht 6' (1.829 m)   Wt 135.8 kg   SpO2 98%   BMI 40.61 kg/m   Pertinent Labs, Studies, and Procedures:  CBC CBC Latest Ref Rng & Units 07/08/2020 07/07/2020 07/06/2020  WBC 4.0 - 10.5 K/uL 9.1 8.6 10.4  Hemoglobin 13.0 - 17.0 g/dL 11.1(L) 11.9(L) 11.5(L)  Hematocrit 39 - 52 % 35.0(L) 37.9(L) 36.4(L)  Platelets 150 - 400 K/uL  318 336 269   BMP Latest Ref Rng & Units 07/08/2020 07/07/2020 07/05/2020  Glucose 70 - 99 mg/dL 128(H) 159(H) 224(H)  BUN 6 - 20 mg/dL <5(L) 7 7  Creatinine 0.61 - 1.24 mg/dL 0.71 0.62 0.77  Sodium 135 - 145 mmol/L 137 137 136  Potassium 3.5 - 5.1 mmol/L 3.7 3.7 4.2  Chloride 98 - 111 mmol/L 107 105 105  CO2 22 - 32 mmol/L 22 23 21(L)  Calcium 8.9 - 10.3 mg/dL 8.4(L) 8.8(L) 8.6(L)   A1c (07/04/20): 13.1  CT Abd/Pelvis (07/04/20): IMPRESSION: 1. Extensive gas and inflammatory changes in the perineum, left of midline compatible with necrotizing fasciitis. 2. Gas and inflammatory changes extend into the base of the scrotum. 3. No intraperitoneal inflammation or DVT is present. 4. Hepatic steatosis. 5. Layering high density fluid in the gallbladder likely represents sludge. No acute cholecystitis is present.  Discharge Instructions:  Mr. Pillsbury,  I am glad you are feeling better! You were admitted with an infection called necrotizing fasciitis in your buttock/perineum. This infection can be dangerous, which is why the surgeons were immediately called. You underwent a debridement on 8/12, followed by a washout on 8/15. Thankfully, the site looks clear of infection and you are stable for discharge. While you were here, it was found that you have diabetes. This likely caused the infection. In order to prevent further infections and complications, it is important that you take the appropriate steps to get your blood sugars under control. This includes taking your insulin as prescribed, following the correct diet, and following-up with your doctor. Please see the following notes:  -Cefalexin ([place]), 587m, twice daily: This is the antibiotic for your infection. You took your first dose this morning (8/17) in the hospital. Therefore, you will need to take the next dose tonight. You will continue taking one pill in the morning and another at night through Saturday, August 21st.  -LANTUS: This is the  long-acting insulin that you should take at bedtime. You will inject 40 units every night. -NOVOLOG: This is the short-acting insulin that you take right before meals (10-15 minutes beforehand). You should inject 15 units each meal.  -A few tips for the insulin: Make sure you are not injecting into the same spot every time. In addition, avoid injecting around your midline (near your belly button) - inject towards the sides of your stomach. When you visit the clinic, we have a diabetes educator that can help you with any questions and make sure you are injecting correctly. Please do not hesitate to give [place] call if you have any questions.     -We have given you some pain medication as well for the surgical wound site. The Nystatin is for the yeast that was noticed around the surgical site - apply this three times per day.  -If you notice that you are having fevers, chills, shortness of breath, worsening pain, troubles urinating or defecating, please call the doctor.   -It was a pleasure meeting you, Mr. ACroom I wish you the best and hope you stay happy and healthy!  Thank you, PSanjuan Dame MD  Signed: BSanjuan Dame MD 07/09/2020, 7:40 AM   Pager: [phone]

## 2020-07-09 NOTE — Plan of Care (Signed)
  RD consulted for nutrition education regarding diabetes.   Lab Results  Component Value Date   HGBA1C 13.1 (H) 07/04/2020   Spoke with pt and girlfriend, who were pleasant and  in good spirits today. Pt acknowledges new diagnosis of diabetes and was able to teach back what DM coordinator discussed with pt earlier this admission. Pt with multiple diet related questions, which this RD answered. RD also reinforced importance of self-management to help prevent further complications and importance of glycemic control to assist with wound healing.   RD provided "Carbohydrate Counting for People with Diabetes" handout from the Academy of Nutrition and Dietetics. Discussed different food groups and their effects on blood sugar, emphasizing carbohydrate-containing foods. Provided list of carbohydrates and recommended serving sizes of common foods.  Discussed importance of controlled and consistent carbohydrate intake throughout the day. Provided examples of ways to balance meals/snacks and encouraged intake of high-fiber, whole grain complex carbohydrates. Teach back method used.  Expect good compliance.  Body mass index is 40.61 kg/m. Pt meets criteria for extreme obesity, class III based on current BMI.  Current diet order is carb modified, patient is consuming approximately 100% of meals at this time. Labs and medications reviewed. No further nutrition interventions warranted at this time. RD contact information provided. If additional nutrition issues arise, please re-consult RD.  Levada Schilling, RD, LDN, CDCES Registered Dietitian II Certified Diabetes Care and Education Specialist Please refer to [facility] for RD and/or RD on-call/weekend/after hours pager

## 2020-07-09 NOTE — Telephone Encounter (Signed)
[facility] EST CARE PER DR BRASWELL; PT APPT 07/12/20 330PM

## 2020-07-12 ENCOUNTER — Other Ambulatory Visit: Payer: Self-pay

## 2020-07-12 ENCOUNTER — Encounter: Payer: Self-pay | Admitting: Student

## 2020-07-12 ENCOUNTER — Ambulatory Visit (INDEPENDENT_AMBULATORY_CARE_PROVIDER_SITE_OTHER): Payer: Self-pay | Admitting: Student

## 2020-07-12 VITALS — BP 134/96 | HR 92 | Temp 97.6°F | Ht 67.0 in | Wt 298.4 lb

## 2020-07-12 DIAGNOSIS — Z0000 Encounter for general adult medical examination without abnormal findings: Secondary | ICD-10-CM | POA: Insufficient documentation

## 2020-07-12 DIAGNOSIS — Z0001 Encounter for general adult medical examination with abnormal findings: Secondary | ICD-10-CM

## 2020-07-12 DIAGNOSIS — Z23 Encounter for immunization: Secondary | ICD-10-CM

## 2020-07-12 DIAGNOSIS — Z794 Long term (current) use of insulin: Secondary | ICD-10-CM

## 2020-07-12 DIAGNOSIS — E119 Type 2 diabetes mellitus without complications: Secondary | ICD-10-CM

## 2020-07-12 DIAGNOSIS — I10 Essential (primary) hypertension: Secondary | ICD-10-CM

## 2020-07-12 DIAGNOSIS — M726 Necrotizing fasciitis: Secondary | ICD-10-CM

## 2020-07-12 NOTE — Assessment & Plan Note (Addendum)
Patient was newly diagnosed with diabetes during recent hospitalization, A1c of 13.1. He has been doing well with his insulin prescription and has been counting carbs and otherwise watching his diet. He has a diabetes log on his phone with blood sugars ranging from 130s-170s.   -continue Lanrus 40 units and Novolog 15 units TID with meals -urine microalbumin/creatinine -referral for diabetic retinal camera exam, opthalmology referral deferred due to lack of insurance -foot exam deferred to next visit due to pain with sitting with recent perineal nec fasc -referral to diabetes educator -follow up in 2 weeks to adjust medication\  ADDENDUM: urine microalb/creatinine ratio moderately elevated at 174. Recent creatinine benign at 0.69. BP also elevated at 136/96. Starting Losartan 25mg  daily Collect CBC, BMP, lipid panel at next visit.

## 2020-07-12 NOTE — Progress Notes (Signed)
   CC: hospital follow up for nec fasc of perineum, diabetes, establish care  HPI:  Scott Logan is a 24 y.o. male with history as below presenting for hospital follow for [facility], and to establish care. Please refer to problem based charting for further details of assessment and plan of current problem and chronic medical conditions.  Past Medical History:  Diagnosis Date  . Asthma     Current Outpatient Medications  Medication Instructions  . acetaminophen (TYLENOL) 1,000 mg, Oral, Every 6 hours PRN  . blood glucose meter kit and supplies KIT Dispense based on patient and insurance preference. Use up to four times daily as directed. (FOR ICD-9 250.00, 250.01).  . cephALEXin (KEFLEX) 500 mg, Oral, 2 times daily  . cyclobenzaprine (FLEXERIL) 10 mg, Oral, 2 times daily PRN  . ibuprofen (ADVIL) 600 mg, Oral, Every 6 hours PRN  . insulin aspart (NOVOLOG) 15 Units, Subcutaneous, 3 times daily with meals  . insulin glargine (LANTUS) 40 Units, Subcutaneous, Daily at bedtime  . Insulin Pen Needle (PEN NEEDLES) 32G X 6 MM MISC 100 each, Does not apply, 3 times daily with meals  . nystatin (MYCOSTATIN/NYSTOP) powder Topical, 3 times daily  . oxyCODONE (OXY IR/ROXICODONE) 5-10 mg, Oral, Every 6 hours PRN    Past Surgical History:  Procedure Laterality Date  . DRESSING CHANGE UNDER ANESTHESIA N/A 07/06/2020   Procedure: DRESSING CHANGE UNDER ANESTHESIA WITH WASH-OUT OF PERINEUM;  Surgeon: Ralene Ok, MD;  Location: [place];  Service: General;  Laterality: N/A;  . INCISION AND DRAINAGE PERIRECTAL ABSCESS N/A 07/04/2020   Procedure: IRRIGATION AND DEBRIDEMENT PERINEAL ABSCESS;  Surgeon: Rolm Bookbinder, MD;  Location: [place];  Service: General;  Laterality: N/A;  . TONSILLECTOMY      Family History  Problem Relation Age of Onset  . Diabetes Sister     Social History   Tobacco Use  . Smoking status: Never Smoker  . Smokeless tobacco: Never Used  Vaping Use  . Vaping Use:  Never used  Substance Use Topics  . Alcohol use: Never  . Drug use: Never    Review of Systems:   Review of Systems  Constitutional: Negative for chills and fever.  HENT: Negative for congestion and sore throat.   Eyes: Negative for blurred vision and double vision.  Respiratory: Negative for cough and shortness of breath.   Cardiovascular: Negative for chest pain and palpitations.  Gastrointestinal: Negative for abdominal pain, constipation, diarrhea, nausea and vomiting.  Genitourinary: Negative for dysuria and frequency.  Musculoskeletal: Negative for joint pain and myalgias.  Skin: Positive for itching and rash.  Neurological: Negative for dizziness, loss of consciousness and headaches.     Physical Exam: Vitals:   07/12/20 1558  BP: (!) 134/96  Pulse: 92  Temp: 97.6 F (36.4 C)  TempSrc: Oral  SpO2: 96%  Weight: 298 lb 6.4 oz (135.4 kg)  Height: _0  (1.702 m)   Constitutional: no acute distress, obese, standing due to discomfort with sitting Head: atraumatic ENT: external ears normal Cardiovascular: regular rate and rhythm, normal heart sounds Pulmonary: effort normal, normal breath sounds bilaterally Abdominal: flat, nontender, no rebound tenderness, bowel sounds normal Skin: warm and dry, well-healing wound in perineum which is packed and dressed, mild erythema, nontender, no purulence Neurological: alert, no focal deficit Psychiatric: normal mood and affect  Assessment & Plan:   See Encounters Tab for problem based charting.  Patient seen with Dr. Rebeca Alert

## 2020-07-12 NOTE — Patient Instructions (Signed)
Thank you for allowing [place] to be a part of your care today, it was pleasure seeing you. We discussed your recent hospitalization and surgery, and diabetes.  I am checking these labs: urine microalbumin/creatine ratio  Your wound looks like it is healing well. You are doing a great job changing the bandages and keeping the wound clean. Please keep your wound care follow up next week.  I reviewed your medication history, and you should be finishing your antibiotics tomorrow.  I want to commend you on the great job you are doing adapting to your diabetes. Your blood sugar log was very good. Continue keeping the log so that we know how to adjust your medications in the future. We will keep the same insulin dosages for now. I am referring you to our diabetes educator. She will also do a diabetic eye exam.   Please follow up in 2 weeks.    Thank you, and please call the Internal Medicine Clinic at [phone] if you have any questions.  Best, Dr. Imogene Burn

## 2020-07-12 NOTE — Assessment & Plan Note (Signed)
-  TDAP administered -lipid panel at next visit

## 2020-07-12 NOTE — Assessment & Plan Note (Signed)
Patient reports good recovery and improvement in pain. Has been taking his antibiotics, which conclude tomorrow. Denies fever, chills, purulence. Has also been applying nystatin with improvement in appearance of yeast and in itching. His significant other has been helping with twice daily dressing changes, more often if he has a BM. Occasionally uses his prescribed oxycodone and does not feel he needs a refill at this time.   -keep scheduled follow up visit with wound care on 8/25 -finish Keflex prescription

## 2020-07-13 LAB — MICROALBUMIN / CREATININE URINE RATIO
Creatinine, Urine: 188.3 mg/dL
Microalb/Creat Ratio: 174 mg/g creat — ABNORMAL HIGH (ref 0–29)
Microalbumin, Urine: 328.4 ug/mL

## 2020-07-15 ENCOUNTER — Telehealth: Payer: Self-pay | Admitting: Student

## 2020-07-15 DIAGNOSIS — I10 Essential (primary) hypertension: Secondary | ICD-10-CM | POA: Insufficient documentation

## 2020-07-15 MED ORDER — LOSARTAN POTASSIUM 25 MG PO TABS: 25.0000 mg | ORAL_TABLET | Freq: Every day | ORAL | 1 refills | Status: DC

## 2020-07-15 MED ORDER — OXYCODONE HCL 5 MG PO TABS: 5.0000 mg | ORAL_TABLET | Freq: Four times a day (QID) | ORAL | 0 refills | Status: AC | PRN

## 2020-07-15 NOTE — Telephone Encounter (Signed)
Pt requesting a nurse to callback [phone]

## 2020-07-15 NOTE — Assessment & Plan Note (Signed)
Hhypertensive at 134/96. Historical BP readings have been moderately elevated at times as well. Never been on BP meds previously.   -start losartan 25mg  daily for BP and comorbid diabetes with moderate microalbuminuria

## 2020-07-15 NOTE — Telephone Encounter (Signed)
Returned call to patient. No answer. Left message on VM requesting return call. L. Jermiah Soderman, RN, BSN     

## 2020-07-15 NOTE — Telephone Encounter (Signed)
Pt rtn phone call. 

## 2020-07-15 NOTE — Telephone Encounter (Signed)
Returned call to patient. No answer. Left message on VM requesting return call. L. Brenlynn Fake, RN, BSN     

## 2020-07-15 NOTE — Addendum Note (Signed)
Addended by: Remo Lipps on: 07/15/2020 09:57 AM   Modules accepted: Orders

## 2020-07-17 ENCOUNTER — Ambulatory Visit (INDEPENDENT_AMBULATORY_CARE_PROVIDER_SITE_OTHER): Payer: Self-pay | Admitting: Internal Medicine

## 2020-07-17 ENCOUNTER — Encounter: Payer: Self-pay | Admitting: Internal Medicine

## 2020-07-17 ENCOUNTER — Other Ambulatory Visit: Payer: Self-pay

## 2020-07-17 VITALS — BP 133/82 | HR 88 | Temp 98.8°F | Ht 67.0 in | Wt 295.2 lb

## 2020-07-17 DIAGNOSIS — M7989 Other specified soft tissue disorders: Secondary | ICD-10-CM

## 2020-07-17 NOTE — Progress Notes (Signed)
   Subjective:     Patient ID: Scott Logan, male    DOB: January 28, 1996, 24 y.o.   MRN: [id number]   HPI: The patient is a 24 y.o. male with type II diabetes on insulin, here for wound in perineum   Patient was hospitalized on 8/11 for new onset diabetes with necrotizing soft tissue infection of the perineum. He had surgical debridement in the OR with Dr. [name] on 8/12.  He is currently doing wet-to-dry dressings twice daily for the past week.  His girlfriend helps with dressing changes and states that the wound looks healthy and at times there is some yellow discharge on the surface of the wound.  He does have moderate drainage although not purulent.  He currently denies acute pain, increased warmth or erythema to the wound.  He does have a yeast infection surrounding the wound but uses nystatin powder daily.  He has a PCP for which he has established care and is currently on long-acting and short acting insulin for his diabetes regimen.   Review of Systems  All other systems reviewed and are negative.    has a past medical history of Asthma.  has a past surgical history that includes Tonsillectomy; Incision and drainage perirectal abscess (N/A, 07/04/2020); and Dressing change under anesthesia (N/A, 07/06/2020).  reports that he has never smoked. He has never used smokeless tobacco. Objective:   Vital Signs BP 133/82 (BP Location: Left Arm, Patient Position: Sitting, Cuff Size: Large)   Pulse 88   Temp 98.8 F (37.1 C) (Oral)   Ht 5\' 7"  (1.702 m)   Wt 295 lb 3.2 oz (133.9 kg)   SpO2 96%   BMI 46.23 kg/m  Vital Signs and Nursing Note Reviewed Physical Exam Skin:    Comments: Perineum wound measures: 19.0 x 4.0 x 2.5 cm Granulation tissue throughout the entire wound bed No tunneling or undermining noted No induration or increased swelling noted  no erythema or increased warmth noted            Assessment/Plan:     ICD-10-CM   1. Necrotizing soft tissue infection   M79.89     Assessment: Necrotizing soft tissue infection of the perineum s/p debridement in the OR on 8/12  The wound bed looks healthy without signs of infection today.  It was cleaned and debrided with wound cleanser and gauze.  I recommended the patient use kerracel ag every 2 days with dressing changes.  He can cover the dressing with an ABD pad that can be changed as needed but to change at least once a day.  We will send an order for supplies through [employer].  We discussed the importance of glucose control in wound healing.  I will see him in follow-up in 1 week  Plan - kerracel ag every 2 days and cover with ABD pad -Follow-up in 1 week  10/12, DO 07/17/2020, 11:00 AM

## 2020-07-17 NOTE — Patient Instructions (Signed)
Nigel Mormon It was a pleasure meeting you today.  Please follow the instructions below for your wound care  Please clean the wound with normal saline prior to applying dressings  1)  Cut and place kerracel Ag dressings every 2 days 2) followed by abd pad  You may change the ABD pad as needed  Continue to use your nystatin powder daily  Please follow up with me in 1 week.  Call us at [phone] with any questions or concerns  [employer] is a medical supply company.  We have sent them an order for your supplies.  Please contact them if you do not receive your supplies or a call from them in the next 72hrs.  Their number is [phone] and website is [email or web address]

## 2020-07-19 NOTE — Progress Notes (Signed)
Internal Medicine Clinic Attending  I saw and evaluated the patient.  I personally confirmed the key portions of the history and exam documented by Dr. Chen and I reviewed pertinent patient test results.  The assessment, diagnosis, and plan were formulated together and I agree with the documentation in the resident's note.  Elynn Patteson, M.D., Ph.D.  

## 2020-07-22 ENCOUNTER — Ambulatory Visit: Payer: Medicaid Other | Admitting: Internal Medicine

## 2020-07-24 ENCOUNTER — Encounter: Payer: Self-pay | Admitting: Student

## 2020-07-24 ENCOUNTER — Other Ambulatory Visit: Payer: Self-pay

## 2020-07-24 ENCOUNTER — Encounter: Payer: Self-pay | Admitting: Internal Medicine

## 2020-07-24 ENCOUNTER — Ambulatory Visit (INDEPENDENT_AMBULATORY_CARE_PROVIDER_SITE_OTHER): Payer: Self-pay | Admitting: Internal Medicine

## 2020-07-24 VITALS — BP 130/91 | HR 86 | Temp 98.4°F

## 2020-07-24 DIAGNOSIS — T8130XA Disruption of wound, unspecified, initial encounter: Secondary | ICD-10-CM

## 2020-07-24 DIAGNOSIS — M7989 Other specified soft tissue disorders: Secondary | ICD-10-CM

## 2020-07-24 NOTE — Progress Notes (Addendum)
   Subjective:     Patient ID: Scott Logan, male    DOB: 1996-09-06, 24 y.o.   MRN: [id number]  Chief Complaint  Patient presents with  . Follow-up     HPI: The patient is a 24 y.o. male with type II diabetes on insulin, here for  follow-up of wound in perineum.  Patient states he was able to use kerracel ag only a couple times because it would fall out during the day.  They report using saline to the area however when using the restroom it would come out.  He returned to wet-to-dry dressings twice daily.  He reports minimal pain.  He denies purulent drainage, increased erythema or warmth to the wound.  Overall he has noticed improvement in wound healing including appearance and size.  Review of Systems  All other systems reviewed and are negative.    has a past medical history of Asthma.  has a past surgical history that includes Tonsillectomy; Incision and drainage perirectal abscess (N/A, 07/04/2020); and Dressing change under anesthesia (N/A, 07/06/2020).  reports that he has never smoked. He has never used smokeless tobacco. Objective:   Vital Signs BP (!) 130/91 (BP Location: Left Arm, Patient Position: Sitting, Cuff Size: Large)   Pulse 86   Temp 98.4 F (36.9 C) (Oral)   SpO2 98%  Vital Signs and Nursing Note Reviewed Physical Exam Skin:    Comments: Perineum wound measures: 17.0 x 3.0 x 2.0 cm Granulation tissue throughout the entire wound bed No tunneling or undermining noted No induration or increased swelling noted  no erythema or increased warmth noted          Assessment/Plan:     ICD-10-CM   1. 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  It is filling in nicely and is decreasing in size.  I recommended the patient use kerracel ag only at  night as this will help keep it in place for a longer time.  He can do wet-to-dry dressings in the daytime as needed 1-2 times daily.  I will see him at follow-up in 1 week.  Plan -kerracel ag at nighttime covered with ABD -Can do wet-to-dry dressings in the daytime as needed -Follow-up in 1 week -In office wet-to-dry dressing placed  Aldean Baker, DO 07/24/2020, 11:29 AM

## 2020-07-25 ENCOUNTER — Telehealth: Payer: Self-pay | Admitting: *Deleted

## 2020-07-25 NOTE — Telephone Encounter (Signed)
Faxed order on (07/18/20) to [facility] for medical supplies for the patient.  Confirmation received and copy scanned into the chart.     Supplies:Kerracel Ag                Saline                Gloves     On (07/19/20) Received order status notification:We will be attempting to contact the patient regarding purchasing their supplies.//AB/CMA

## 2020-07-31 ENCOUNTER — Ambulatory Visit (INDEPENDENT_AMBULATORY_CARE_PROVIDER_SITE_OTHER): Payer: Self-pay | Admitting: Internal Medicine

## 2020-07-31 ENCOUNTER — Telehealth: Payer: Self-pay | Admitting: Dietician

## 2020-07-31 ENCOUNTER — Other Ambulatory Visit: Payer: Self-pay

## 2020-07-31 ENCOUNTER — Encounter: Payer: Self-pay | Admitting: Internal Medicine

## 2020-07-31 VITALS — BP 116/77 | HR 88 | Temp 98.8°F

## 2020-07-31 DIAGNOSIS — M7989 Other specified soft tissue disorders: Secondary | ICD-10-CM

## 2020-07-31 DIAGNOSIS — T8130XA Disruption of wound, unspecified, initial encounter: Secondary | ICD-10-CM

## 2020-07-31 NOTE — Telephone Encounter (Signed)
Called patient about scheduling an appointment. He wanted to meet in person so a visit was scheduled for tomrrow at 215 PM.  Rotation of insulin injections was a question he had. Plan to provide Handout and reinforcement at tomorrow's appointment.

## 2020-07-31 NOTE — Progress Notes (Signed)
   Subjective:     Patient ID: Scott Logan, male    DOB: 10-Jul-1996, 24 y.o.   MRN: [id number]  Chief Complaint  Patient presents with  . Follow-up    HPI: The patient is a 24 y.o. male with type II diabetes on insulin,here forfollow-up of wound in perineum.  Patient states he has been using kerracel ag  at night and wet-to-dry dressings during the day.  His girlfriend helps with dressing changes and reports continued improvement to the overall size and appearance of the wound.  They have no complaints today.  Patient denies increased warmth or erythema to the wound site or purulent drainage.  Review of Systems  All other systems reviewed and are negative.    has a past medical history of Asthma.  has a past surgical history that includes Tonsillectomy; Incision and drainage perirectal abscess (N/A, 07/04/2020); and Dressing change under anesthesia (N/A, 07/06/2020).  reports that he has never smoked. He has never used smokeless tobacco. Objective:   Vital Signs BP 116/77 (BP Location: Left Arm, Patient Position: Sitting, Cuff Size: Large)   Pulse 88   Temp 98.8 F (37.1 C) (Oral)   SpO2 98%  Vital Signs and Nursing Note Reviewed Physical Exam Skin:    Comments: Perineum wound measures: 15.0 x 2.7 x 2.0 cm Granulation tissue throughout the entire wound bed No tunneling or undermining noted No induration or increased swelling noted  no erythema or increased warmth noted              Assessment/Plan:     ICD-10-CM   1. 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  Patient has run out of kerracel ag.  At this time I would like to switch to silver alginate dressings at night.  Samples given to the patient to last for about 2 weeks.  He can do  wet-to-dry dressings in the daytime as needed 1-2 times daily.  I will see him in follow-up in 2 weeks.  Plan -Silver alginate at nighttime covered with ABD -Can do wet-to-dry dressings in the daytime as needed -Follow-up in 2 weeks -In office wet-to-dry dressing placed  Aldean Baker, DO 08/01/2020, 12:44 PM

## 2020-08-01 ENCOUNTER — Ambulatory Visit: Payer: Medicaid Other | Admitting: Dietician

## 2020-08-07 ENCOUNTER — Other Ambulatory Visit: Payer: Self-pay

## 2020-08-07 ENCOUNTER — Ambulatory Visit (INDEPENDENT_AMBULATORY_CARE_PROVIDER_SITE_OTHER): Payer: Medicaid Other | Admitting: Dietician

## 2020-08-07 ENCOUNTER — Encounter: Payer: Self-pay | Admitting: Dietician

## 2020-08-07 ENCOUNTER — Other Ambulatory Visit: Payer: Self-pay | Admitting: Dietician

## 2020-08-07 DIAGNOSIS — E119 Type 2 diabetes mellitus without complications: Secondary | ICD-10-CM

## 2020-08-07 MED ORDER — PEN NEEDLES 32G X 6 MM MISC: 0 refills | Status: DC

## 2020-08-07 MED ORDER — INSULIN ASPART 100 UNIT/ML FLEXPEN: 15.0000 [IU] | PEN_INJECTOR | Freq: Three times a day (TID) | SUBCUTANEOUS | 0 refills | Status: DC

## 2020-08-07 MED ORDER — INSULIN GLARGINE 100 UNIT/ML ~~LOC~~ SOLN: 40.0000 [IU] | Freq: Every day | SUBCUTANEOUS | 0 refills | Status: DC

## 2020-08-07 MED FILL — UNIFINE PENTIPS 32GX5/32: 32G X 4 MM | 25 days supply | Qty: 100 | Fill #0

## 2020-08-07 MED FILL — NOVOLOG FLEXPEN SYRINGE: 100 | 27 days supply | Qty: 12 | Fill #0

## 2020-08-07 NOTE — Telephone Encounter (Signed)
Wants to use the IM program and gets these today if possible.

## 2020-08-07 NOTE — Patient Instructions (Addendum)
Please make a follow up with one of our doctors.    Take Novolog insulin about 15 minutes BEFORE eating meals  Try to take lantus (long acting insulin) about the same time every day.   Food wise- reminder that plant fats are healthy because they decrease inflammation...avacados, olive oil, nuts, seeds  Let's follow up in 4 weeks or when you get insurance.   Call or send me a message in mychart anytime.   Lupita Leash [phone]

## 2020-08-07 NOTE — Progress Notes (Addendum)
Diabetes Self-Management Education  Visit Type: First/Initial  Appt. Start Time: 1030 Appt. End Time: 1125  08/07/2020  [name], identified by name and date of birth, is a 24 y.o. male with a diagnosis of Diabetes: Type 2.   ASSESSMENT  Scott Logan was recently diagnosed with diabetes while in the hospital for an infection. However, he thinks he had diabetes before that because he had symptoms such as increased thirst and urination and slow healing wounds.  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 CBGs from his [employer] relion meter: Today- ` 9 am 197 before appointment 9/13    ~ 11am 167 9/12    ~11 am 179 9/11   ~ 11 am 182 9/10   ~ 11 am- 203 ( ate too much trail ( m & Ms and raisins with nuts- mix the night before) 9/9                     216 ( stayed up late and ate something)  9/8       ~ 11am- 183 In august it was lower: 107/111/112- he thinks because it got low in the hospital when he was not eating as much  He was provided with a contour meter and 20 strips along with a Dexcom CGM to stat using with his smart phone as soon as possible. Prescriptions were requested per his request.   Weight 294 lb 4.8 oz (133.5 kg). Body mass index is 46.09 kg/m.   Diabetes Self-Management Education - 08/07/20 1000      Visit Information   Visit Type First/Initial      Initial Visit   Diabetes Type Type 2    Are you currently following a meal plan? Yes    What type of meal plan do you follow? AIMING FOR 60 GRAMS CARB PER MEAL    Are you taking your medications as prescribed? Yes    Date Diagnosed 06/2020      Psychosocial Assessment   Patient Belief/Attitude about Diabetes Motivated to manage diabetes    Self-care barriers Lack of material resources    Self-management support Doctor's office;Friends;Family;CDE visits    Patient Concerns  Nutrition/Meal planning;Medication;Monitoring;Problem Solving;Glycemic Control    Special Needs None    Preferred Learning Style No preference indicated    Learning Readiness Ready    How often do you need to have someone help you when you read instructions, pamphlets, or other written materials from your doctor or pharmacy? 2 - Rarely      Pre-Education Assessment   Patient understands the diabetes disease and treatment process. Needs Instruction    Patient understands incorporating nutritional management into lifestyle. Needs Instruction    Patient undertands incorporating physical activity into lifestyle. Needs Instruction    Patient understands using medications safely. Needs Instruction    Patient understands monitoring blood glucose, interpreting and using results Needs Instruction    Patient understands prevention, detection, and treatment of acute complications. Demonstrates understanding / competency    Patient understands prevention, detection, and treatment of chronic complications. Needs Instruction    Patient understands how to develop strategies to address psychosocial issues. Demonstrates understanding / competency    Patient understands how to develop strategies to promote health/change behavior. Needs Instruction      Complications   Last HgB A1C per patient/outside source 13 %  How often do you check your blood sugar? 1-2 times/day    Fasting Blood glucose range (mg/dL) 626-948;546-270    Postprandial Blood glucose range (mg/dL) 350-093;>818    Number of hypoglycemic episodes per month 0    Number of hyperglycemic episodes per week 2    Can you tell when your blood sugar is high? Yes    What do you do if your blood sugar is high? drinks water    Have you had a dilated eye exam in the past 12 months? No      Dietary Intake   Breakfast skips    Lunch eggs, whole wheat english muffin,     Snack (afternoon) trail mix    Dinner sandwich or chicken breast with  broccoli,or burger at [employer], whole wheat wraps, or broc cheddar soup    Snack (evening) trail mix    Beverage(s) water, diet drinks, gatorade zero      Exercise   Exercise Type ADL's;Light (walking / raking leaves)      Patient Education   Previous Diabetes Education Yes (please comment)   while in the hospital   Disease state  Definition of diabetes, type 1 and 2, and the diagnosis of diabetes;Factors that contribute to the development of diabetes    Nutrition management  Role of diet in the treatment of diabetes and the relationship between the three main macronutrients and blood glucose level;Reviewed blood glucose goals for pre and post meals and how to evaluate the patients' food intake on their blood glucose level.    Physical activity and exercise  Role of exercise on diabetes management, blood pressure control and cardiac health.    Medications Taught/reviewed insulin injection, site rotation, insulin storage and needle disposal.;Reviewed patients medication for diabetes, action, purpose, timing of dose and side effects.    Monitoring Taught/discussed recording of test results and interpretation of SMBG.;Yearly dilated eye exam    Acute complications Discussed and identified patients' treatment of hyperglycemia.    Chronic complications Retinopathy and reason for yearly dilated eye exams;Reviewed with patient heart disease, higher risk of, and prevention    Personal strategies to promote health Helped patient develop diabetes management plan for (enter comment)   checking glucose  using sample CGM     Individualized Goals (developed by patient)   Medications Other (comment)   rotate injection sites   Monitoring  test my blood glucose as discussed      Post-Education Assessment   Patient understands the diabetes disease and treatment process. Demonstrates understanding / competency    Patient understands incorporating nutritional management into lifestyle. Demonstrates understanding /  competency    Patient undertands incorporating physical activity into lifestyle. Demonstrates understanding / competency    Patient understands using medications safely. Demonstrates understanding / competency    Patient understands monitoring blood glucose, interpreting and using results Demonstrates understanding / competency    Patient understands prevention, detection, and treatment of acute complications. Demonstrates understanding / competency    Patient understands how to develop strategies to promote health/change behavior. Demonstrates understanding / competency      Outcomes   Expected Outcomes Demonstrated interest in learning. Expect positive outcomes    Future DMSE 4-6 wks   when he gets insurance   Program Status Not Completed           Individualized Plan for Diabetes Self-Management Training:   Learning Objective:  Patient will have a greater understanding of diabetes self-management. Patient education plan is to attend individual and/or group  sessions per assessed needs and concerns.   Plan:   Patient Instructions  Please make a follow up with on of our doctors.    Take Novolog insulin about 15 minutes BEFORE eating meals  Try to take lantus (long acting insulin) about the same time every day.   Food wise- reminder that plant fats are healthy because they decrease inflammation...  Let's follow up in 4 weeks or when you get insurance.   Call or send me a message in mychart anytime.   Lupita Leash [phone]    Expected Outcomes:  Demonstrated interest in learning. Expect positive outcomes Education material provided: Diabetes Resources If problems or questions, patient to contact team via:  Phone, Email and mychart Suggest transition to GLP-1 as soon as possible.  Future DSME appointment: 4-6 wks (when he gets insurance)  Norm Parcel, RD 08/07/2020 2:44 PM.

## 2020-08-07 NOTE — Telephone Encounter (Signed)
[facility] Out[facility] pharmacy is fine with this patient using IM program, but they are waiting for doctor to change his lantus to sololstar pens. Can his pen needles also be changed to the 4 mm? Thank you!

## 2020-08-08 ENCOUNTER — Other Ambulatory Visit: Payer: Self-pay | Admitting: Internal Medicine

## 2020-08-08 MED ORDER — INSULIN PEN NEEDLE 32G X 4 MM MISC: 0 refills | Status: DC

## 2020-08-08 MED ORDER — LANTUS SOLOSTAR 100 UNIT/ML ~~LOC~~ SOPN: 40.0000 [IU] | PEN_INJECTOR | Freq: Every day | SUBCUTANEOUS | 3 refills | Status: DC

## 2020-08-08 MED FILL — LANTUS SOLOSTAR 100 UNITS/M: 100 | 30 days supply | Qty: 12 | Fill #0

## 2020-08-08 NOTE — Telephone Encounter (Signed)
[facility] Out[facility] pharmacy took a verbal to change Lantus vial to Lantus pens. Can you please update his medication list to the Lantus solostar pens? Thank you!

## 2020-08-08 NOTE — Telephone Encounter (Signed)
I think all is good now.  I was asking you to update his medication list  to be sure he got Lantus pens with his future refills.Thank you!

## 2020-08-12 ENCOUNTER — Ambulatory Visit: Payer: Medicaid Other | Admitting: Internal Medicine

## 2020-08-21 ENCOUNTER — Encounter: Payer: Self-pay | Admitting: Student

## 2020-08-21 ENCOUNTER — Other Ambulatory Visit: Payer: Self-pay | Admitting: Student

## 2020-08-21 ENCOUNTER — Other Ambulatory Visit: Payer: Self-pay

## 2020-08-21 ENCOUNTER — Ambulatory Visit: Payer: Self-pay | Admitting: Student

## 2020-08-21 VITALS — BP 129/83 | HR 96 | Temp 98.5°F | Ht 67.0 in | Wt 299.0 lb

## 2020-08-21 DIAGNOSIS — Z794 Long term (current) use of insulin: Secondary | ICD-10-CM

## 2020-08-21 DIAGNOSIS — M726 Necrotizing fasciitis: Secondary | ICD-10-CM

## 2020-08-21 DIAGNOSIS — I10 Essential (primary) hypertension: Secondary | ICD-10-CM

## 2020-08-21 DIAGNOSIS — Z6841 Body Mass Index (BMI) 40.0 and over, adult: Secondary | ICD-10-CM

## 2020-08-21 DIAGNOSIS — E119 Type 2 diabetes mellitus without complications: Secondary | ICD-10-CM

## 2020-08-21 DIAGNOSIS — E669 Obesity, unspecified: Secondary | ICD-10-CM

## 2020-08-21 MED ORDER — LISINOPRIL 5 MG PO TABS: 2.5000 mg | ORAL_TABLET | Freq: Every day | ORAL | 11 refills | Status: DC

## 2020-08-21 MED ORDER — FREESTYLE LIBRE 2 SENSOR MISC: 12 refills | Status: DC

## 2020-08-21 MED ORDER — METFORMIN HCL ER 500 MG PO TB24: 1000.0000 mg | ORAL_TABLET | Freq: Two times a day (BID) | ORAL | 2 refills | Status: DC

## 2020-08-21 MED FILL — LISINOPRIL 5 MG TABS: 5 | 30 days supply | Qty: 15 | Fill #0

## 2020-08-21 MED FILL — METFORMIN HCL ER 500 MG TB2: 500 | 30 days supply | Qty: 70 | Fill #0

## 2020-08-21 NOTE — Progress Notes (Signed)
° °  CC: Diabetes and HTN follow-up  HPI:  Scott Logan is a 24 y.o. young man with recently diagnosed diabetes (diagnosed 06/2020) and HTN (diagnosed 06/2020)  who presents to clinic for follow-up of DM and HTN. His last clinic visit was on 07/12/20 with his PCP, Dr. Imogene Burn.   To see the details of this patient's management of their acute and chronic problems, please refer to the Assessment & Plan under the Encounters tab.    Past Medical History:  Diagnosis Date   Asthma    Review of Systems:    Review of Systems  Constitutional: Negative for diaphoresis, malaise/fatigue and weight loss.  Eyes: Negative for blurred vision.  Respiratory: Negative for cough and shortness of breath.   Cardiovascular: Negative for chest pain.  Gastrointestinal: Negative for abdominal pain, nausea and vomiting.  Genitourinary: Negative for dysuria, frequency and urgency.  Musculoskeletal: Negative for myalgias.  Neurological: Negative for dizziness, weakness and headaches.  Endo/Heme/Allergies: Negative for polydipsia.    Physical Exam:  Vitals:   08/21/20 1028  BP: 129/83  Pulse: 96  Temp: 98.5 F (36.9 C)  TempSrc: Oral  SpO2: 98%  Weight: 299 lb (135.6 kg)  Height: 5\' 7"  (1.702 m)   Constitutional: very pleasant and obese though well-appearing young man, sitting in chair, in no acute distress HENT: normocephalic atraumatic, mucous membranes moist Eyes: conjunctiva non-erythematous Neck: supple Cardiovascular: regular rate and rhythm, no m/r/g Pulmonary/Chest: normal work of breathing on room air, lungs clear to auscultation bilaterally Neurological: alert & oriented x 3, normal gait  Assessment & Plan:   See Encounters Tab for problem based charting.  Patient seen with Dr. 

## 2020-08-21 NOTE — Assessment & Plan Note (Addendum)
BP 129/83 today.  Patient reports he took the losartan 25 mg daily for 29 days however stopped about 1 week ago and did not pick up the refill because the initial prescription was ~$40. He states he was told the prescription would be $4 at his pharmacy.  He denies headache, blurry vision, dizziness. He has not been monitoring his blood pressure at home. States he tolerated the losartan well.  A/P: Patient's BP at goal of <130/80 today despite not being on his prescribed losartan 25 mg for ~1 week. However, with his moderate microalbuminuria, treatment with ACE/ARB is still warranted. Will prescribe lisinopril 2.5 mg daily since can start a low dose and titrate as needed.  - start lisinopril 2.5 mg daily - BMP today

## 2020-08-21 NOTE — Assessment & Plan Note (Addendum)
Patient reports he has minimal pain. Notes that his wound is "basically healed." has been seeing plastic surgery regularly, next appointment 08/26/20.   Plan - Continue following with plastic surgery for wound care - CBC today

## 2020-08-21 NOTE — Patient Instructions (Addendum)
Scott Logan,   Thank you for your visit to the [facility] today. It was a pleasure seeing you. Today we discussed the following:  1) Diabetes: awesome job controlling your blood sugars!  - I would like you to continue taking Lantus 40 units nightly - Please discontinue your mealtime insulin - Check your morning fasting blood sugar daily and record this value so that we can review it at your next appointment. If these sugars are <90, please let our office know so that we can adjust your insulin regimen. - Also check your blood sugar if you feel symptomatic low or high. - We are starting you on metformin. This medicine can cause some GI upset, but in order to minimize this, I would like you to taper it in the following way:   Take 1 tablet (500 mg) by mouth once in the morning for 1 week. Then take 1 tablet (500 mg) by mouth twice daily (morning and evening) for 1 week. Then take 1 tablet (500 mg) in the morning and 2 tablets (1000 mg) in the evening for 1 week. Then take 2 tablets (1000 mg) in the morning and evening.  2) Blood pressure: your blood pressure was great at 129/83 today - In order to protect your kidneys, we would like you to take lisinopril 2.5 mg (1/2 tablet) daily. This medicine may be adjusted at your next appointment.  3) You had some lab work done today. We will call you if these are abnormal!   We would like to see you back in mid-November for a follow-up of your diabetes and blood pressure. Please bring all of your medications with you.   If you have any questions or concerns, please call our clinic at [phone] between 9am-5pm. Outside of these hours, call [phone] and ask for the internal medicine resident on call. If you feel you are having a medical emergency please call 911.

## 2020-08-21 NOTE — Progress Notes (Signed)
Internal Medicine Clinic Attending  I saw and evaluated the patient.  I personally confirmed the key portions of the history and exam documented by Dr. Claudette Laws and I reviewed pertinent patient test results.  The assessment, diagnosis, and plan were formulated together and I agree with the documentation in the resident's note.  As noted, plan is to initiate metformin as first line oral medication and add a GLP1ra or SGLT2i if he remains above target.

## 2020-08-21 NOTE — Assessment & Plan Note (Signed)
Patient's weight has been stable at 299 lb for some time. He has been receiving diabetes education with Lupita Leash. Will start metformin today.  Plan: - lipid panel today

## 2020-08-21 NOTE — Assessment & Plan Note (Signed)
>>  ASSESSMENT AND PLAN FOR BMI 45.0-49.9, ADULT (HCC) WRITTEN ON 08/21/2020  2:39 PM BY WATSON, JULIA, MD  Patient's weight has been stable at 299 lb for some time. He has been receiving diabetes education with Lupita Leash. Will start metformin today.  Plan: - lipid panel today

## 2020-08-21 NOTE — Assessment & Plan Note (Signed)
Last A1c 13.1 ~1 month ago on 07/04/20 during hospitalization. Patient reports he has been doing well on his regimen of Lantus 40 units nightly as well as Novolog 15 units TID with meals. Denies symptomatic highs or lows. His CGM report demonstrates 91% of his blood glucoses have been in his target range of 70-180, with only 8% above goal and 1% below goal.   He is working with our diabetes educator, Butch Penny, whom he met with on 08/07/20 and who met with him during his appointment today. 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 Butch Penny explains that because he does not have insurance, he can only get 3 more days with this current CGM. The patient is scheduled to meet with financial counseling Diamond Nickel) on 09/04/20.  Plan: - Continue Lantus 40 units nightly - Discontinue mealtime insulin - Start metformin at 500 mg once daily. Instructed patient how to titrate slowly up to 1000 mg twice daily. - Advised patient to check his blood glucose in the morning as well as if he is symptomatic. He was instructed to let us know if his sugars are <90. - Foot exam next visit - Follow-up in mid-November for repeat A1c and medication adjustment as necessary - Start lisinopril 2.5 mg daily (discontinue losartan 25 mg daily) for moderate microalbuminuria

## 2020-08-22 LAB — LIPID PANEL
Chol/HDL Ratio: 5.6 ratio — ABNORMAL HIGH (ref 0.0–5.0)
Cholesterol, Total: 178 mg/dL (ref 100–199)
HDL: 32 mg/dL — ABNORMAL LOW (ref >39)
LDL Chol Calc (NIH): 112 mg/dL — ABNORMAL HIGH (ref 0–99)
Triglycerides: 195 mg/dL — ABNORMAL HIGH (ref 0–149)
VLDL Cholesterol Cal: 34 mg/dL (ref 5–40)

## 2020-08-22 LAB — CBC WITH DIFFERENTIAL/PLATELET
Basophils Absolute: 0 10*3/uL (ref 0.0–0.2)
Basos: 0 %
EOS (ABSOLUTE): 0.2 10*3/uL (ref 0.0–0.4)
Eos: 2 %
Hematocrit: 42 % (ref 37.5–51.0)
Hemoglobin: 13.3 g/dL (ref 13.0–17.7)
Immature Grans (Abs): 0 10*3/uL (ref 0.0–0.1)
Immature Granulocytes: 0 %
Lymphocytes Absolute: 2.6 10*3/uL (ref 0.7–3.1)
Lymphs: 26 %
MCH: 25.2 pg — ABNORMAL LOW (ref 26.6–33.0)
MCHC: 31.7 g/dL (ref 31.5–35.7)
MCV: 80 fL (ref 79–97)
Monocytes Absolute: 0.6 10*3/uL (ref 0.1–0.9)
Monocytes: 6 %
Neutrophils Absolute: 6.6 10*3/uL (ref 1.4–7.0)
Neutrophils: 66 %
Platelets: 333 10*3/uL (ref 150–450)
RBC: 5.27 x10E6/uL (ref 4.14–5.80)
RDW: 13.6 % (ref 11.6–15.4)
WBC: 10.1 10*3/uL (ref 3.4–10.8)

## 2020-08-22 LAB — BMP8+ANION GAP
Anion Gap: 14 mmol/L (ref 10.0–18.0)
BUN/Creatinine Ratio: 20 (ref 9–20)
BUN: 17 mg/dL (ref 6–20)
CO2: 20 mmol/L (ref 20–29)
Calcium: 8.8 mg/dL (ref 8.7–10.2)
Chloride: 103 mmol/L (ref 96–106)
Creatinine, Ser: 0.87 mg/dL (ref 0.76–1.27)
GFR calc Af Amer: 140 mL/min/{1.73_m2} (ref >59)
GFR calc non Af Amer: 121 mL/min/{1.73_m2} (ref >59)
Glucose: 200 mg/dL — ABNORMAL HIGH (ref 65–99)
Potassium: 5 mmol/L (ref 3.5–5.2)
Sodium: 137 mmol/L (ref 134–144)

## 2020-08-26 ENCOUNTER — Encounter: Payer: Self-pay | Admitting: Internal Medicine

## 2020-08-26 ENCOUNTER — Ambulatory Visit (INDEPENDENT_AMBULATORY_CARE_PROVIDER_SITE_OTHER): Payer: Self-pay | Admitting: Internal Medicine

## 2020-08-26 ENCOUNTER — Other Ambulatory Visit: Payer: Self-pay

## 2020-08-26 VITALS — HR 98 | Temp 98.3°F

## 2020-08-26 DIAGNOSIS — M7989 Other specified soft tissue disorders: Secondary | ICD-10-CM

## 2020-08-26 DIAGNOSIS — T8130XA Disruption of wound, unspecified, initial encounter: Secondary | ICD-10-CM

## 2020-08-26 NOTE — Progress Notes (Signed)
   Subjective:     Patient ID: Scott Logan, male    DOB: 11-28-1995, 24 y.o.   MRN: [id number]  Chief Complaint  Patient presents with  . Follow-up    HPI: The patient is a 24 y.o. male with type II diabetes on insulin,here forfollow-up of wound in perineum.  Patient states he has been using silver alginate dressings daily until he ran out of supplies.  He has been using wet-to-dry dressings with ABD pad for the past week.  He overall reports continued improvement to the wound site and states that the wound size has decreased significantly.  He has no complaints today.  Patient denies drainage, increased warmth or erythema to the wound site.  Review of Systems  All other systems reviewed and are negative.    has a past medical history of Asthma.  has a past surgical history that includes Tonsillectomy; Incision and drainage perirectal abscess (N/A, 07/04/2020); and Dressing change under anesthesia (N/A, 07/06/2020).  reports that he has never smoked. He has never used smokeless tobacco. Objective:   Vital Signs Pulse 98   Temp 98.3 F (36.8 C) (Oral)   SpO2 97%  Vital Signs and Nursing Note Reviewed Physical Exam Skin:    Comments: Perineum wound measures: 10.0 x 0.5 x 0.5 cm Granulation tissue throughout the entire wound bed No tunneling or undermining noted No induration or increased swelling noted  no erythema or increased warmth noted               Assessment/Plan:     ICD-10-CM   1. 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  It is almost completely healed.  At this time I recommended either continuing silver alginate dressings that we can order through [facility] or he can use Vaseline with an ABD pad.  At this time he would like to try the  Vaseline with ABD pad and will call our office if he would like further supplies sent.  I am hoping this will heal up completely by the time I see him in 2 weeks.  Plan -Vaseline with ABD pad dressing changes daily -Follow-up in 2 weeks  Aldean Baker, DO 08/26/2020, 1:13 PM

## 2020-09-04 ENCOUNTER — Ambulatory Visit: Payer: Medicaid Other

## 2020-09-08 NOTE — Progress Notes (Addendum)
   Subjective:     Patient ID: Scott Logan, male    DOB: 1996-03-03, 24 y.o.   MRN: [id number]  Chief Complaint  Patient presents with  . Follow-up    HPI: The patient is a 24 y.o. 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  He has been doing daily dressing changes consisting of Vaseline with an ABD pad. Denies pain, F/C, N/V.   Objective:   Vital Signs BP 133/78 (BP Location: Left Arm, Patient Position: Sitting, Cuff Size: Large)   Pulse (!) 104   Temp 98.8 F (37.1 C) (Oral)   SpO2 98%  Vital Signs and Nursing Note Reviewed Skin: Wound continues to improve. Posterior portion of wound has fully close. Anterior portion has good granulation tissue. No signs of infection.    Assessment/Plan:     ICD-10-CM   1. 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 Necrotizing soft tissue infection  M79.89    Scott Logan is doing very well. Wound continues to improved. Posterior portion has fully closed. Anterior portion has good heathy tissue visible.  Continue with daily dressing changes of Vaseline and ABD. Follow up in 4 weeks. Call office with any questions/concerns.   Pictures were obtained of the patient and placed in the chart with the patient's or guardian's permission.  The 21st Century Cures Act was signed into law in 2016 which includes the topic of electronic health records.  This provides immediate access to information in MyChart.  This includes consultation notes, operative notes, office notes, lab results and pathology reports.  If you have any questions about what you read please let us know at your next visit or call us at the office.  We are right here with you.   Eldridge Abrahams, PA-C 09/09/2020, 7:32 PM

## 2020-09-09 ENCOUNTER — Ambulatory Visit: Payer: Medicaid Other | Admitting: Internal Medicine

## 2020-09-09 ENCOUNTER — Other Ambulatory Visit: Payer: Self-pay | Admitting: Student

## 2020-09-09 ENCOUNTER — Ambulatory Visit (INDEPENDENT_AMBULATORY_CARE_PROVIDER_SITE_OTHER): Payer: Self-pay | Admitting: Plastic Surgery

## 2020-09-09 ENCOUNTER — Other Ambulatory Visit: Payer: Self-pay

## 2020-09-09 ENCOUNTER — Encounter: Payer: Self-pay | Admitting: Plastic Surgery

## 2020-09-09 ENCOUNTER — Other Ambulatory Visit: Payer: Self-pay | Admitting: Internal Medicine

## 2020-09-09 VITALS — BP 133/78 | HR 104 | Temp 98.8°F

## 2020-09-09 DIAGNOSIS — T8130XA Disruption of wound, unspecified, initial encounter: Secondary | ICD-10-CM

## 2020-09-09 DIAGNOSIS — M7989 Other specified soft tissue disorders: Secondary | ICD-10-CM

## 2020-09-09 MED FILL — LANTUS SOLOSTAR 100 UNITS/M: 100 | 30 days supply | Qty: 12 | Fill #0

## 2020-09-09 NOTE — Telephone Encounter (Signed)
Requesting to speak with Dr. Imogene Burn, please call pt back. Pt states he is completely out of insulin glargine (LANTUS SOLOSTAR) 100 UNIT/ML Solostar Pen.

## 2020-09-10 ENCOUNTER — Telehealth: Payer: Self-pay | Admitting: Student

## 2020-09-10 NOTE — Telephone Encounter (Signed)
TC to pharmacy, RX is ready for pick up and is $4   TC to patient and he was notified. SChaplin, RN,BSN

## 2020-09-10 NOTE — Telephone Encounter (Signed)
Pt calling to report he is still unable to pick up his medication and was asked to contact [facility] to get an  Approval for the following medication:  insulin glargine (LANTUS SOLOSTAR) 100 UNIT/ML Solostar Pen [[id number]]  [facility] - [place], [place] - [address].

## 2020-09-16 ENCOUNTER — Other Ambulatory Visit: Payer: Self-pay

## 2020-09-16 MED FILL — METFORMIN HCL ER 500 MG TB2: 500 | 30 days supply | Qty: 120 | Fill #1

## 2020-09-16 MED FILL — LISINOPRIL 5 MG TABS: 5 | 30 days supply | Qty: 15 | Fill #1

## 2020-09-16 NOTE — Telephone Encounter (Signed)
Patient has refills on both meds at [facility]. Call placed to patient to contact pharmacy for refills. He will do that now. Kinnie Feil, BSN, RN-BC

## 2020-09-16 NOTE — Telephone Encounter (Signed)
Need refill on lisinopril (ZESTRIL) 5 MG tablet  metFORMIN (GLUCOPHAGE XR) 500 MG 24 hr tablet ;pt contact [phone]   [facility] - [place], [place] - [address]

## 2020-09-30 ENCOUNTER — Other Ambulatory Visit: Payer: Self-pay | Admitting: Student

## 2020-09-30 MED ORDER — LANTUS SOLOSTAR 100 UNIT/ML ~~LOC~~ SOPN
PEN_INJECTOR | SUBCUTANEOUS | 3 refills | Status: DC
Start: 2020-09-30 — End: 2021-01-20

## 2020-09-30 MED FILL — LANTUS SOLOSTAR 100 UNITS/M: 100 | 30 days supply | Qty: 12 | Fill #0

## 2020-09-30 NOTE — Addendum Note (Signed)
Addended by: Remo Lipps on: 09/30/2020 03:42 PM   Modules accepted: Orders

## 2020-10-02 ENCOUNTER — Other Ambulatory Visit: Payer: Self-pay | Admitting: Student

## 2020-10-02 ENCOUNTER — Other Ambulatory Visit: Payer: Self-pay | Admitting: Internal Medicine

## 2020-10-02 DIAGNOSIS — E119 Type 2 diabetes mellitus without complications: Secondary | ICD-10-CM

## 2020-10-02 MED ORDER — UNIFINE PENTIPS 32G X 4 MM MISC: 3 refills | Status: DC

## 2020-10-02 MED FILL — UNIFINE PENTIPS 32GX5/32: 32G X 4 MM | 25 days supply | Qty: 100 | Fill #0

## 2020-10-06 NOTE — Progress Notes (Deleted)
   Subjective:     Patient ID: ELIZABETH HAFF, male    DOB: [birth year], 24 y.o.   MRN: [id number]  No chief complaint on file.   HPI: The patient is a 24 y.o. 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  He has been seeing Dr. Mikey Bussing for his wound care.  At last visit on 10/18 the posterior portion of the wound had fully closed and the anterior portion had good granulation tissue present.  He continue daily dressing changes of Vaseline and ABD.  Today   Review of Systems   Objective:   Vital Signs There were no vitals taken for this visit. Vital Signs and Nursing Note Reviewed  Physical Exam    Assessment/Plan:  No diagnosis found.  ***   Eldridge Abrahams, PA-C 10/06/2020, 9:12 AM

## 2020-10-09 ENCOUNTER — Ambulatory Visit: Payer: Medicaid Other | Admitting: Plastic Surgery

## 2020-10-09 DIAGNOSIS — T8130XA Disruption of wound, unspecified, initial encounter: Secondary | ICD-10-CM

## 2020-10-09 NOTE — Progress Notes (Deleted)
   Subjective:     Patient ID: Scott Logan, male    DOB: 06/28/1996, 24 y.o.   MRN: [id number]  No chief complaint on file.   HPI: The patient is a 24 y.o. 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  He has been seeing Dr. Mikey Bussing for his continued wound care.  At last visit on 10/18 the posterior portion of the wound had fully closed and the anterior portion had good granulation tissue present.  Review of Systems   Objective:   Vital Signs There were no vitals taken for this visit. Vital Signs and Nursing Note Reviewed  Physical Exam    Assessment/Plan:     ICD-10-CM   1. 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  Eldridge Abrahams, PA-C 10/09/2020, 9:40 PM

## 2020-10-10 ENCOUNTER — Ambulatory Visit: Payer: Medicaid Other | Admitting: Plastic Surgery

## 2020-10-10 DIAGNOSIS — T8130XA Disruption of wound, unspecified, initial encounter: Secondary | ICD-10-CM

## 2020-10-13 NOTE — Progress Notes (Signed)
Subjective:     Patient ID: Scott Logan, male    DOB: 30-Dec-1995, 24 y.o.   MRN: [id number]  Chief Complaint  Patient presents with  . Follow-up    HPI: The patient is a 24 y.o. 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  He has been seeing Dr. Mikey Bussing for his wound care.  At last visit on 10/18 the posterior portion of the wound had fully closed and the anterior portion had good healthy tissue visible. He has been doing daily dressing changes consisting of Vaseline with an ABD pad.  Patient reports he is doing well.  He feels there is not much change in his wound.  He denies wound pain, fever, chills, nausea, vomiting.  Review of Systems  Constitutional: Negative for chills and fever.  HENT: Negative for congestion and sore throat.   Respiratory: Negative for cough.   Cardiovascular: Negative for chest pain.  Gastrointestinal: Negative for abdominal pain, nausea and vomiting.  Skin: Positive for wound (perineum wound). Negative for rash.     Objective:   Vital Signs BP 115/73 (BP Location: Left Arm, Patient Position: Sitting, Cuff Size: Large)   Pulse 96   Temp 98.4 F (36.9 C) (Oral)   SpO2 98%  Vital Signs and Nursing Note Reviewed  Physical Exam Exam conducted with a chaperone present.  Constitutional:      General: He is not in acute distress.    Appearance: Normal appearance. He is obese. He is not ill-appearing.  HENT:     Head: Normocephalic and atraumatic.  Eyes:     Extraocular Movements: Extraocular movements intact.  Cardiovascular:     Rate and Rhythm: Normal rate.  Pulmonary:     Effort: Pulmonary effort is normal.  Musculoskeletal:     Cervical back: Normal range of motion.  Skin:    General: Skin is warm and dry.     Coloration: Skin is not pale.     Findings: No erythema or rash.     Comments: Posterior portion of wound remains well healed. Extreme anterior  portion of the wound is closed. ~5 cm anterior portion of the wound remains not yet fully closed, but has good healthy tissue visible.  No signs of infection, redness, purulent drainage.  Neurological:     Mental Status: He is alert and oriented to person, place, and time.     Gait: Gait is intact.  Psychiatric:        Mood and Affect: Mood and affect normal.        Behavior: Behavior normal.        Thought Content: Thought content normal.        Cognition and Memory: Memory normal.        Judgment: Judgment normal.       Assessment/Plan:     ICD-10-CM   1. 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's perineum wound is continuing to heal nicely.  Majority of the wound is fully closed.  He has an approximately 5 cm section on the anterior portion of the wound that has not yet fully closed but has good healthy tissue visible.  No signs of infection.  He can continue to apply Vaseline and ABD daily.  Continue showering normally.  Follow-up in 4 to 6 weeks.  Return precautions provided.  Call office with any questions/concerns.  Eldridge Abrahams, PA-C 10/14/2020,  1:34 PM

## 2020-10-14 ENCOUNTER — Ambulatory Visit (INDEPENDENT_AMBULATORY_CARE_PROVIDER_SITE_OTHER): Payer: Self-pay | Admitting: Plastic Surgery

## 2020-10-14 ENCOUNTER — Other Ambulatory Visit: Payer: Self-pay

## 2020-10-14 ENCOUNTER — Encounter: Payer: Self-pay | Admitting: Plastic Surgery

## 2020-10-14 VITALS — BP 115/73 | HR 96 | Temp 98.4°F

## 2020-10-14 DIAGNOSIS — T8130XA Disruption of wound, unspecified, initial encounter: Secondary | ICD-10-CM

## 2020-10-14 MED FILL — METFORMIN HCL ER 500 MG TB2: 500 | 30 days supply | Qty: 120 | Fill #2

## 2020-10-14 MED FILL — LISINOPRIL 5 MG TABS: 5 | 30 days supply | Qty: 15 | Fill #2

## 2020-10-14 MED FILL — UNIFINE PENTIPS 32GX5/32: 32G X 4 MM | 25 days supply | Qty: 100 | Fill #0

## 2020-10-31 MED FILL — LANTUS SOLOSTAR 100 UNITS/M: 100 | 30 days supply | Qty: 12 | Fill #1

## 2020-11-14 MED FILL — LISINOPRIL 5 MG TABS: 5 | 30 days supply | Qty: 15 | Fill #3

## 2020-11-14 MED FILL — METFORMIN HCL ER 500 MG TB2: 500 | 12 days supply | Qty: 50 | Fill #3

## 2020-11-26 ENCOUNTER — Ambulatory Visit: Payer: Medicaid Other | Admitting: Plastic Surgery

## 2020-11-26 MED FILL — LANTUS SOLOSTAR 100 UNITS/M: 100 | 30 days supply | Qty: 12 | Fill #2

## 2020-11-29 ENCOUNTER — Other Ambulatory Visit: Payer: Self-pay | Admitting: Student

## 2020-11-29 DIAGNOSIS — E119 Type 2 diabetes mellitus without complications: Secondary | ICD-10-CM

## 2020-11-29 MED FILL — METFORMIN HCL 1000 MG TABS: 1000 | 30 days supply | Qty: 60 | Fill #0

## 2020-12-13 MED FILL — LISINOPRIL 5 MG TABS: 5 | 30 days supply | Qty: 15 | Fill #4

## 2020-12-20 ENCOUNTER — Ambulatory Visit (INDEPENDENT_AMBULATORY_CARE_PROVIDER_SITE_OTHER): Payer: Medicaid Other | Admitting: Plastic Surgery

## 2020-12-20 ENCOUNTER — Other Ambulatory Visit: Payer: Self-pay

## 2020-12-20 ENCOUNTER — Encounter: Payer: Self-pay | Admitting: Plastic Surgery

## 2020-12-20 VITALS — BP 124/81 | HR 86

## 2020-12-20 DIAGNOSIS — M726 Necrotizing fasciitis: Secondary | ICD-10-CM

## 2020-12-20 NOTE — Progress Notes (Signed)
   Subjective:    Patient ID: Scott Logan, male    DOB: Oct 15, 1996, 25 y.o.   MRN: [id number]  Patient is a 25 year old male here for follow-up on the gluteal wound.  He had necrotizing fasciitis and was diagnosed with diabetes.  He has been able to get his blood sugars under better control.  He looks really good.  He has a very small superficial opening in the midline.  It looks more like a scratch.  There is no sign of infection.  The gluteal area is completely healed.  He has done extremely.  I am very happy for him.     Review of Systems  Constitutional: Negative for activity change and appetite change.  HENT: Negative.   Eyes: Negative.   Respiratory: Negative for chest tightness and shortness of breath.   Cardiovascular: Negative for leg swelling.  Gastrointestinal: Negative for abdominal distention and abdominal pain.  Endocrine: Negative.   Genitourinary: Negative.   Musculoskeletal: Negative.        Objective:   Physical Exam Vitals and nursing note reviewed.  Constitutional:      Appearance: Normal appearance.  HENT:     Head: Normocephalic and atraumatic.  Cardiovascular:     Rate and Rhythm: Normal rate.     Pulses: Normal pulses.  Pulmonary:     Effort: Pulmonary effort is normal. No respiratory distress.  Skin:    General: Skin is warm.     Coloration: Skin is not jaundiced.     Findings: No bruising.  Neurological:     General: No focal deficit present.     Mental Status: He is alert and oriented to person, place, and time.  Psychiatric:        Mood and Affect: Mood normal.        Behavior: Behavior normal.        Assessment & Plan:     ICD-10-CM   1. Necrotizing fasciitis of left buttock (HCC)  M72.6     Continue with Vaseline.  Try not to let the area get overheated.  Shift as able to not create more pressure to the area decrease in sugar and increased protein.  This will help with healing.  Follow-up as needed.

## 2020-12-24 MED FILL — METFORMIN HCL 1000 MG TABS: 1000 | 30 days supply | Qty: 60 | Fill #1

## 2020-12-24 MED FILL — LANTUS SOLOSTAR 100 UNITS/M: 100 | 30 days supply | Qty: 12 | Fill #3

## 2020-12-30 ENCOUNTER — Other Ambulatory Visit: Payer: Self-pay | Admitting: Internal Medicine

## 2020-12-30 MED ORDER — METFORMIN HCL 1000 MG PO TABS: 1000.0000 mg | ORAL_TABLET | Freq: Two times a day (BID) | ORAL | 11 refills | Status: DC

## 2020-12-30 NOTE — Addendum Note (Signed)
Addended by: Lenward Chancellor D on: 12/30/2020 07:44 PM   Modules accepted: Orders

## 2021-01-02 ENCOUNTER — Telehealth: Payer: Self-pay | Admitting: Pharmacist

## 2021-01-02 NOTE — Telephone Encounter (Signed)
Left message for patient requesting call back. Provided my direct number.

## 2021-01-09 MED FILL — LISINOPRIL 5 MG TABS: 5 | 30 days supply | Qty: 15 | Fill #5

## 2021-01-20 ENCOUNTER — Other Ambulatory Visit: Payer: Self-pay | Admitting: Internal Medicine

## 2021-01-20 ENCOUNTER — Other Ambulatory Visit: Payer: Self-pay | Admitting: Student

## 2021-01-20 MED FILL — UNIFINE PENTIPS 32GX5/32: 32G X 4 MM | 25 days supply | Qty: 100 | Fill #1

## 2021-01-21 ENCOUNTER — Other Ambulatory Visit: Payer: Self-pay | Admitting: Internal Medicine

## 2021-01-21 MED ORDER — LANTUS SOLOSTAR 100 UNIT/ML ~~LOC~~ SOPN: PEN_INJECTOR | SUBCUTANEOUS | 3 refills | Status: DC

## 2021-01-21 MED FILL — LANTUS SOLOSTAR 100 UNITS/M: 100 | 30 days supply | Qty: 12 | Fill #0

## 2021-01-27 MED FILL — METFORMIN HCL 1000 MG TABS: 1000 | 30 days supply | Qty: 60 | Fill #0

## 2021-02-17 MED FILL — LANTUS SOLOSTAR 100 UNITS/M: 100 | 30 days supply | Qty: 12 | Fill #1

## 2021-02-17 MED FILL — LISINOPRIL 5 MG TABS: 5 | 30 days supply | Qty: 15 | Fill #6

## 2021-02-27 ENCOUNTER — Other Ambulatory Visit (HOSPITAL_COMMUNITY): Payer: Self-pay

## 2021-02-27 MED FILL — Metformin HCl Tab 1000 MG: ORAL | 30 days supply | Qty: 60 | Fill #0 | Status: AC

## 2021-03-18 ENCOUNTER — Other Ambulatory Visit (HOSPITAL_COMMUNITY): Payer: Self-pay

## 2021-03-18 MED FILL — Lisinopril Tab 5 MG: ORAL | 30 days supply | Qty: 15 | Fill #0 | Status: AC

## 2021-03-18 MED FILL — Insulin Glargine Soln Pen-Injector 100 Unit/ML: SUBCUTANEOUS | 30 days supply | Qty: 12 | Fill #0 | Status: AC

## 2021-03-24 ENCOUNTER — Encounter: Payer: Self-pay | Admitting: Student

## 2021-03-24 ENCOUNTER — Other Ambulatory Visit: Payer: Self-pay

## 2021-03-24 ENCOUNTER — Ambulatory Visit: Payer: Self-pay | Admitting: Student

## 2021-03-24 VITALS — BP 133/81 | HR 98 | Temp 98.6°F | Ht 67.0 in | Wt 311.6 lb

## 2021-03-24 DIAGNOSIS — Z0000 Encounter for general adult medical examination without abnormal findings: Secondary | ICD-10-CM

## 2021-03-24 DIAGNOSIS — E119 Type 2 diabetes mellitus without complications: Secondary | ICD-10-CM

## 2021-03-24 DIAGNOSIS — Z6841 Body Mass Index (BMI) 40.0 and over, adult: Secondary | ICD-10-CM

## 2021-03-24 DIAGNOSIS — I10 Essential (primary) hypertension: Secondary | ICD-10-CM

## 2021-03-24 LAB — POCT GLYCOSYLATED HEMOGLOBIN (HGB A1C): Hemoglobin A1C: 6.7 % — AB (ref 4.0–5.6)

## 2021-03-24 LAB — GLUCOSE, CAPILLARY: Glucose-Capillary: 117 mg/dL — ABNORMAL HIGH (ref 70–99)

## 2021-03-24 NOTE — Assessment & Plan Note (Signed)
Weight today is up about 13 pounds from prior visit in August.  He has previously met with Butch Penny and discussed diet changes, we reinforced this today.  We are limited in what medical interventions we can provide due to his lack of insurance.  We discussed the importance of this, and he will consider enrolling in insurance at the next open enrollment period.

## 2021-03-24 NOTE — Patient Instructions (Signed)
Thank you for allowing [place] to be a part of your care today, it was a pleasure seeing you. We discussed your diabetes, obesity, health insurance  Your hemoglobin A1c, which is a 20-month blood sugar estimate, is greatly improved today.  It was 13.1 in August, and today is 6.7.  Our goal is below 7 so you are doing excellent.  Please continue your current metformin and Lantus.  Lowering your weight is going to be key to long-term health.  Please try to put in place to diet changes that you and Lupita Leash have discussed previously.  There are also some medications and surgeries which can help with weight, but these will be difficult to afford without insurance.  In the open room and.  Which is coming up this fall, consider purchasing an insurance plan.  We will revisit your health screening after you obtain the orange card or insurance.  Please follow up in 6 months   Thank you, and please call the Internal Medicine Clinic at [phone] if you have any questions.  Best, Dr. Imogene Burn

## 2021-03-24 NOTE — Assessment & Plan Note (Signed)
Patient offered healthcare maintenance items.  He is currently working on obtaining the orange card and would like to defer these until that time.

## 2021-03-24 NOTE — Progress Notes (Signed)
   CC: Diabetes, obesity  HPI:  [name] is a 25 y.o. male with history as below presenting for follow-up on the above. Please refer to problem based charting for further details of assessment and plan of current problem and chronic medical conditions.   Past Medical History:  Diagnosis Date  . Asthma   . Diabetes (HCC)   . Hypertension    Review of Systems:   Review of Systems  Genitourinary: Negative for dysuria.  Skin: Negative for itching and rash.  Neurological: Positive for tingling. Negative for sensory change.     Physical Exam: Vitals:   03/24/21 1339  BP: 133/81  Pulse: 98  Temp: 98.6 F (37 C)  TempSrc: Oral  SpO2: 99%  Weight: (!) 311 lb 9.6 oz (141.3 kg)  Height: 5\' 7"  (1.702 m)   Constitutional: no acute distress Head: atraumatic ENT: external ears normal Cardiovascular: regular rate and rhythm, normal heart sounds Pulmonary: effort normal, normal breath sounds bilaterally Abdominal: flat Skin: warm and dry Neurological: alert, no focal deficit Psychiatric: normal mood and affect  Assessment & Plan:   See Encounters Tab for problem based charting.  Patient discussed with Dr. 

## 2021-03-24 NOTE — Assessment & Plan Note (Signed)
BP: 133/81   Denies chest pain, palpitations, dizziness, headaches, LOC.  Losartan was switched to lisinopril 2.5 mg at last visit due to cost.  He has for comorbid diabetes with moderate microalbuminuria.  Last labs in September 2021 were benign.  - Continue lisinopril 2.5 mg - Check labs at next visit

## 2021-03-24 NOTE — Assessment & Plan Note (Signed)
Lab Results  Component Value Date   HGBA1C 6.7 (A) 03/24/2021   HGBA1C 13.1 (H) 07/04/2020  He occasionally has some mild bilateral foot tingling, once every couple of weeks.  He checks his feet regularly and does not have any lesions, applies lotion to avoid dryness.  His diabetes is complicated by necrotizing fasciitis in 2021 which is well-healed, has followed regularly with plastic surgery.  Home medication of NovoLog 40 units nightly, states he is doing well with this.  Mealtime insulin was discontinued last visit and metformin was started.  He has titrated up to 1000 mg twice daily.    - Continue Lantus 40 units nightly - Continue metformin 1000 mg twice daily - Would like to put him on semaglutide for weight loss benefit, but uninsured.  Discussed obtaining insurance with him, he will consider this at the next open enrollment period -Had elevated urine microalbumin/creatinine ratio of 174 in August, repeat at next visit to confirm persistent elevation per up-to-date, Already on lisinopril 2.5 for this

## 2021-03-24 NOTE — Assessment & Plan Note (Signed)
>>  ASSESSMENT AND PLAN FOR BMI 45.0-49.9, ADULT (HCC) WRITTEN ON 03/24/2021  5:39 PM BY Remo Lipps, MD  Weight today is up about 13 pounds from prior visit in August.  He has previously met with Lupita Leash and discussed diet changes, we reinforced this today.  We are limited in what medical interventions we can provide due to his lack of insurance.  We discussed the importance of this, and he will consider enrolling in insurance at the next open enrollment period.

## 2021-03-25 NOTE — Progress Notes (Signed)
Internal Medicine Clinic Attending  Case discussed with Dr. Chen  At the time of the visit.  We reviewed the resident's history and exam and pertinent patient test results.  I agree with the assessment, diagnosis, and plan of care documented in the resident's note. 

## 2021-03-26 NOTE — Addendum Note (Signed)
Addended by: Remo Lipps on: 03/26/2021 03:45 PM   Modules accepted: Level of Service

## 2021-03-27 ENCOUNTER — Other Ambulatory Visit (HOSPITAL_COMMUNITY): Payer: Self-pay

## 2021-03-27 MED FILL — Metformin HCl Tab 1000 MG: ORAL | 30 days supply | Qty: 60 | Fill #1 | Status: AC

## 2021-04-17 MED FILL — Insulin Glargine Soln Pen-Injector 100 Unit/ML: SUBCUTANEOUS | 30 days supply | Qty: 12 | Fill #1 | Status: AC

## 2021-04-17 MED FILL — Lisinopril Tab 5 MG: ORAL | 30 days supply | Qty: 15 | Fill #1 | Status: AC

## 2021-04-18 ENCOUNTER — Other Ambulatory Visit (HOSPITAL_COMMUNITY): Payer: Self-pay

## 2021-04-28 ENCOUNTER — Other Ambulatory Visit (HOSPITAL_COMMUNITY): Payer: Self-pay

## 2021-04-28 MED FILL — Insulin Pen Needle 32 G X 4 MM (1/6" or 5/32"): 25 days supply | Qty: 100 | Fill #0 | Status: AC

## 2021-04-28 MED FILL — Metformin HCl Tab 1000 MG: ORAL | 30 days supply | Qty: 60 | Fill #2 | Status: AC

## 2021-05-07 ENCOUNTER — Encounter: Payer: Self-pay | Admitting: *Deleted

## 2021-05-19 ENCOUNTER — Other Ambulatory Visit: Payer: Self-pay | Admitting: Internal Medicine

## 2021-05-19 ENCOUNTER — Other Ambulatory Visit (HOSPITAL_COMMUNITY): Payer: Self-pay

## 2021-05-19 MED FILL — Lisinopril Tab 5 MG: ORAL | 30 days supply | Qty: 15 | Fill #2 | Status: AC

## 2021-05-20 ENCOUNTER — Other Ambulatory Visit: Payer: Self-pay | Admitting: Internal Medicine

## 2021-05-20 ENCOUNTER — Other Ambulatory Visit (HOSPITAL_COMMUNITY): Payer: Self-pay

## 2021-05-21 ENCOUNTER — Other Ambulatory Visit (HOSPITAL_COMMUNITY): Payer: Self-pay

## 2021-05-21 ENCOUNTER — Other Ambulatory Visit: Payer: Self-pay | Admitting: Internal Medicine

## 2021-05-21 MED ORDER — LANTUS SOLOSTAR 100 UNIT/ML ~~LOC~~ SOPN
40.0000 [IU] | PEN_INJECTOR | Freq: Every day | SUBCUTANEOUS | 3 refills | Status: DC
  Filled 2021-05-21: qty 12, 30d supply, fill #0
  Filled 2021-06-23: qty 12, 30d supply, fill #1
  Filled 2021-07-29: qty 12, 30d supply, fill #2

## 2021-05-21 MED ORDER — LANTUS SOLOSTAR 100 UNIT/ML ~~LOC~~ SOPN
PEN_INJECTOR | SUBCUTANEOUS | 3 refills | Status: DC
  Filled 2021-05-21: qty 12, fill #0

## 2021-05-27 ENCOUNTER — Encounter: Payer: Self-pay | Admitting: *Deleted

## 2021-05-28 ENCOUNTER — Other Ambulatory Visit (HOSPITAL_COMMUNITY): Payer: Self-pay

## 2021-05-28 MED FILL — Metformin HCl Tab 1000 MG: ORAL | 30 days supply | Qty: 60 | Fill #3 | Status: AC

## 2021-06-23 ENCOUNTER — Other Ambulatory Visit (HOSPITAL_COMMUNITY): Payer: Self-pay

## 2021-06-23 MED FILL — Metformin HCl Tab 1000 MG: ORAL | 30 days supply | Qty: 60 | Fill #4 | Status: AC

## 2021-06-23 MED FILL — Lisinopril Tab 5 MG: ORAL | 30 days supply | Qty: 15 | Fill #3 | Status: AC

## 2021-07-29 ENCOUNTER — Other Ambulatory Visit (HOSPITAL_COMMUNITY): Payer: Self-pay

## 2021-07-29 MED FILL — Metformin HCl Tab 1000 MG: ORAL | 30 days supply | Qty: 60 | Fill #5 | Status: AC

## 2021-07-29 MED FILL — Lisinopril Tab 5 MG: ORAL | 30 days supply | Qty: 15 | Fill #4 | Status: AC

## 2021-08-05 ENCOUNTER — Ambulatory Visit: Payer: Medicaid Other | Admitting: Physician Assistant

## 2021-08-12 ENCOUNTER — Other Ambulatory Visit (HOSPITAL_COMMUNITY): Payer: Self-pay

## 2021-08-12 ENCOUNTER — Ambulatory Visit: Payer: Self-pay | Admitting: Internal Medicine

## 2021-08-12 VITALS — BP 129/93 | HR 84 | Temp 98.2°F | Ht 67.0 in | Wt 319.3 lb

## 2021-08-12 DIAGNOSIS — I10 Essential (primary) hypertension: Secondary | ICD-10-CM

## 2021-08-12 DIAGNOSIS — E119 Type 2 diabetes mellitus without complications: Secondary | ICD-10-CM

## 2021-08-12 LAB — POCT GLYCOSYLATED HEMOGLOBIN (HGB A1C): Hemoglobin A1C: 8 % — AB (ref 4.0–5.6)

## 2021-08-12 LAB — GLUCOSE, CAPILLARY: Glucose-Capillary: 227 mg/dL — ABNORMAL HIGH (ref 70–99)

## 2021-08-12 MED ORDER — LIRAGLUTIDE 18 MG/3ML ~~LOC~~ SOPN
0.6000 mg | PEN_INJECTOR | Freq: Every day | SUBCUTANEOUS | 3 refills | Status: DC
  Filled 2021-08-12: qty 6, 30d supply, fill #0

## 2021-08-12 MED ORDER — LANTUS SOLOSTAR 100 UNIT/ML ~~LOC~~ SOPN
40.0000 [IU] | PEN_INJECTOR | Freq: Every day | SUBCUTANEOUS | 3 refills | Status: DC
  Filled 2021-08-12 – 2021-08-26 (×2): qty 12, 30d supply, fill #0
  Filled 2021-09-26: qty 12, 30d supply, fill #1
  Filled 2021-10-29 – 2021-10-30 (×2): qty 12, 30d supply, fill #2
  Filled 2021-12-05: qty 12, 30d supply, fill #3

## 2021-08-12 MED ORDER — INSULIN PEN NEEDLE 32G X 4 MM MISC
3 refills | Status: DC
  Filled 2021-08-12 – 2021-08-29 (×2): qty 100, 25d supply, fill #0
  Filled 2021-10-29 – 2021-10-30 (×2): qty 100, 25d supply, fill #1
  Filled 2022-02-11: qty 100, 25d supply, fill #2

## 2021-08-12 MED ORDER — METFORMIN HCL 1000 MG PO TABS
1000.0000 mg | ORAL_TABLET | Freq: Two times a day (BID) | ORAL | 5 refills | Status: DC
  Filled 2021-08-12: qty 60, fill #0
  Filled 2021-08-26: qty 60, 30d supply, fill #0
  Filled 2021-09-26: qty 60, 30d supply, fill #1
  Filled 2021-10-29 – 2021-10-30 (×2): qty 60, 30d supply, fill #2
  Filled 2021-12-05: qty 60, 30d supply, fill #3
  Filled 2022-01-02: qty 60, 30d supply, fill #4
  Filled 2022-02-11: qty 60, 30d supply, fill #5

## 2021-08-12 NOTE — Progress Notes (Signed)
   CC: diabetes follow up  HPI:  Scott Logan is a 25 y.o. male with HTN, diabetes, and asthma who presents to the [facility] for follow up of his diabetes. Please see problem-based list for further details, assessments, and plans.   Past Medical History:  Diagnosis Date   Asthma    Diabetes (HCC)    Hypertension    Review of Systems:  Review of Systems  Constitutional:  Negative for chills and fever.  HENT: Negative.    Eyes:  Negative for blurred vision.  Respiratory:  Negative for cough and shortness of breath.   Cardiovascular:  Negative for chest pain and palpitations.  Gastrointestinal:  Negative for abdominal pain, diarrhea, nausea and vomiting.  Genitourinary:  Negative for dysuria, hematuria and urgency.  Musculoskeletal: Negative.   Neurological: Negative.     Physical Exam:  Vitals:   08/12/21 0954  BP: (!) 129/93  Pulse: 84  Temp: 98.2 F (36.8 C)  TempSrc: Oral  SpO2: 97%  Weight: (!) 319 lb 4.8 oz (144.8 kg)  Height: 5\' 7"  (1.702 m)   General: No acute distress. Head: Normocephalic. Atraumatic. CV: RRR. No murmurs, rubs, or gallops. No LE edema Pulmonary: Lungs CTAB. Normal effort. No wheezing or rales. Abdominal: Soft, nontender, nondistended. Normal bowel sounds. Obese abdomen Extremities: Palpable radial and DP pulses. Normal ROM. Skin: Warm and dry. No obvious rash or lesions. Neuro: A&Ox3. Moves all extremities. Normal sensation. No focal deficit. Psych: Normal mood and affect   Assessment & Plan:   See Encounters Tab for problem based charting.  Patient seen with Dr. 

## 2021-08-12 NOTE — Assessment & Plan Note (Signed)
BP 129/93 today. Patient denies any headaches, dizziness, lightheadedness, chest pain, palpitations, or any other sxs at this time.   Plan: - Continue lisinopril 5mg 

## 2021-08-12 NOTE — Patient Instructions (Signed)
Thank you, [name] for allowing [place] to provide your care today. Today we discussed: Diabetes: A1c is increased to 8 today (was 6.7). Continue using 40units of insulin every night and taking metformin twice a day. Start Victoza 0.6mg  injection everyday and we will follow up in 1 month. Please be sure to check your sugars every morning  I have ordered the following labs for you:   Lab Orders         Glucose, capillary         POC Hbg A1C       Referrals ordered today:   Referral Orders  No referral(s) requested today     I have ordered the following medication/changed the following medications:   Stop the following medications: Medications Discontinued During This Encounter  Medication Reason   metFORMIN (GLUCOPHAGE) 1000 MG tablet Reorder   Insulin Pen Needle 32G X 4 MM MISC Reorder   insulin glargine (LANTUS SOLOSTAR) 100 UNIT/ML Solostar Pen Reorder     Start the following medications: Meds ordered this encounter  Medications   insulin glargine (LANTUS SOLOSTAR) 100 UNIT/ML Solostar Pen    Sig: Inject 40 Units into the skin daily.    Dispense:  12 mL    Refill:  3    IM PROGRAM   metFORMIN (GLUCOPHAGE) 1000 MG tablet    Sig: TAKE 1 TABLET (1,000 MG TOTAL) BY MOUTH 2 (TWO) TIMES DAILY WITH A MEAL.    Dispense:  60 tablet    Refill:  5    IM program   Insulin Pen Needle 32G X 4 MM MISC    Sig: USE AS DIRECTED TO INJECT INSULIN FOUR TIMES DAILY.    Dispense:  100 each    Refill:  3    im PER RX 30 IM program   liraglutide (VICTOZA) 18 MG/3ML SOPN    Sig: Inject 0.6 mg into the skin daily.    Dispense:  3 mL    Refill:  3    IM program     Follow up:  1 month     Remember: To start Victoza today and be sure to change your injection sites daily!  Should you have any questions or concerns please call the internal medicine clinic at [phone].     Elza Rafter, D.O. [facility]

## 2021-08-12 NOTE — Assessment & Plan Note (Addendum)
A1c increased from 6.7 to 8 today. Patient notes that since his A1c was so well controlled at his last visit, he has been less strict with his diet and has noted that his sugars have been higher, as high as 230. He also notes that he had an episode of food poisoning last week which caused significant nausea and vomiting, and he noticed that his sugars were elevated during this illness, as well. Patient notes that he takes his 40u of Lantus every night, but will maybe miss this dose every couple of weeks or so. He continues to take metformin 1 g BID, as well. Discussed adding a GLP-1 agonist to help with his sugar control, as well as to help with weight loss. The patient would prefer an oral medication, however, Rybelsus is unaffordable without any insurance. Discussed starting Victoza (daily injection) as this is covered under the IM program, until the patient is able to get insurance, and he is agreeable. Starting at 0.6mg  dose of Victoza and will uptitrate if he tolerates this medicine. The end goal for the patient is to be weaned off of insulin.  Plan: - Continue lantus 40u qhs - Continue metformin 1000mg  BID - Start Victoza 0.6mg  daily injection; recheck sugars in 1 month to discuss dose adjustment  - Patient will follow up with his eye doctor

## 2021-08-15 NOTE — Progress Notes (Signed)
Internal Medicine Clinic Attending ° °Case discussed with Dr. Atway  At the time of the visit.  We reviewed the resident’s history and exam and pertinent patient test results.  I agree with the assessment, diagnosis, and plan of care documented in the resident’s note.  °

## 2021-08-26 ENCOUNTER — Other Ambulatory Visit: Payer: Self-pay

## 2021-08-26 ENCOUNTER — Other Ambulatory Visit (HOSPITAL_COMMUNITY): Payer: Self-pay

## 2021-08-27 ENCOUNTER — Other Ambulatory Visit (HOSPITAL_COMMUNITY): Payer: Self-pay

## 2021-08-29 ENCOUNTER — Other Ambulatory Visit (HOSPITAL_COMMUNITY): Payer: Self-pay

## 2021-09-01 ENCOUNTER — Other Ambulatory Visit: Payer: Self-pay | Admitting: Student

## 2021-09-01 ENCOUNTER — Other Ambulatory Visit (HOSPITAL_COMMUNITY): Payer: Self-pay

## 2021-09-01 DIAGNOSIS — I10 Essential (primary) hypertension: Secondary | ICD-10-CM

## 2021-09-01 DIAGNOSIS — E119 Type 2 diabetes mellitus without complications: Secondary | ICD-10-CM

## 2021-09-02 ENCOUNTER — Other Ambulatory Visit (HOSPITAL_COMMUNITY): Payer: Self-pay

## 2021-09-04 ENCOUNTER — Other Ambulatory Visit (HOSPITAL_COMMUNITY): Payer: Self-pay

## 2021-09-04 ENCOUNTER — Other Ambulatory Visit: Payer: Self-pay | Admitting: Student

## 2021-09-04 DIAGNOSIS — E119 Type 2 diabetes mellitus without complications: Secondary | ICD-10-CM

## 2021-09-04 DIAGNOSIS — I10 Essential (primary) hypertension: Secondary | ICD-10-CM

## 2021-09-05 ENCOUNTER — Other Ambulatory Visit (HOSPITAL_COMMUNITY): Payer: Self-pay

## 2021-09-07 ENCOUNTER — Encounter: Payer: Self-pay | Admitting: Internal Medicine

## 2021-09-08 ENCOUNTER — Other Ambulatory Visit (HOSPITAL_COMMUNITY): Payer: Self-pay

## 2021-09-08 ENCOUNTER — Encounter: Payer: Self-pay | Admitting: Plastic Surgery

## 2021-09-08 NOTE — Telephone Encounter (Signed)
I called pt to see if he could come in today but no answer and vm has not been set up, unable to leave a message.

## 2021-09-08 NOTE — Telephone Encounter (Signed)
Thank you! I agree with in person evaluation. This can be evaluated at his appointment tomorrow or if he would like to come in today.

## 2021-09-09 ENCOUNTER — Other Ambulatory Visit (HOSPITAL_COMMUNITY): Payer: Self-pay

## 2021-09-09 ENCOUNTER — Encounter: Payer: Medicaid Other | Admitting: Internal Medicine

## 2021-09-09 ENCOUNTER — Other Ambulatory Visit: Payer: Self-pay | Admitting: Student

## 2021-09-09 DIAGNOSIS — E119 Type 2 diabetes mellitus without complications: Secondary | ICD-10-CM

## 2021-09-09 DIAGNOSIS — I10 Essential (primary) hypertension: Secondary | ICD-10-CM

## 2021-09-10 ENCOUNTER — Other Ambulatory Visit (HOSPITAL_COMMUNITY): Payer: Self-pay

## 2021-09-10 NOTE — Telephone Encounter (Signed)
Next appt scheduled 10/20 with Dr Mcarthur Rossetti.

## 2021-09-11 ENCOUNTER — Other Ambulatory Visit (HOSPITAL_COMMUNITY): Payer: Self-pay

## 2021-09-11 ENCOUNTER — Other Ambulatory Visit: Payer: Self-pay | Admitting: Student

## 2021-09-11 ENCOUNTER — Encounter: Payer: Medicaid Other | Admitting: Internal Medicine

## 2021-09-11 DIAGNOSIS — I10 Essential (primary) hypertension: Secondary | ICD-10-CM

## 2021-09-11 DIAGNOSIS — E119 Type 2 diabetes mellitus without complications: Secondary | ICD-10-CM

## 2021-09-11 MED ORDER — LISINOPRIL 5 MG PO TABS
2.5000 mg | ORAL_TABLET | Freq: Every day | ORAL | 11 refills | Status: DC
  Filled 2021-09-11 – 2021-09-26 (×2): qty 15, 30d supply, fill #0
  Filled 2021-10-29 – 2021-10-30 (×2): qty 15, 30d supply, fill #1
  Filled 2022-01-02: qty 15, 30d supply, fill #2
  Filled 2022-02-11: qty 15, 30d supply, fill #3

## 2021-09-17 ENCOUNTER — Encounter: Payer: Medicaid Other | Admitting: Internal Medicine

## 2021-09-19 ENCOUNTER — Other Ambulatory Visit (HOSPITAL_COMMUNITY): Payer: Self-pay

## 2021-09-21 IMAGING — CT CT ABD-PELV W/ CM
2 of 4 series · 15 of 46 positions shown, 17 images · IV contrast (omnipaque)
Comparison: None.

CLINICAL DATA: Abdominal abscess/infection.  Perirectal abscess.

EXAM:
CT ABDOMEN AND PELVIS WITH CONTRAST
TECHNIQUE: Multidetector CT imaging of the abdomen and pelvis was performed
using the standard protocol following bolus administration of
intravenous contrast.
CONTRAST:  100mL OMNIPAQUE IOHEXOL 300 MG/ML  SOLN

[Series 3: a/p w/ 5mm · axial · 0.98mm/px · z∈[+731,+1271]mm · 12 of 123 slices shown, 14 images]
[im 10/123  soft-tissue]
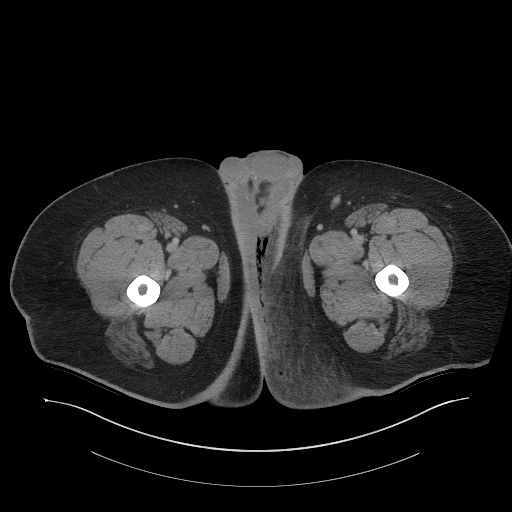
[im 10/123  bone]
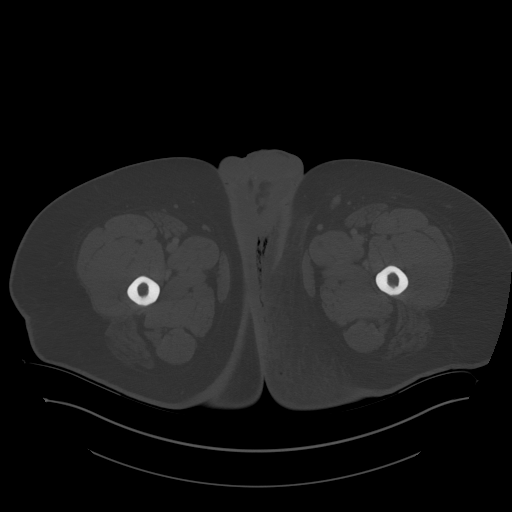
[im 20/123  soft-tissue]
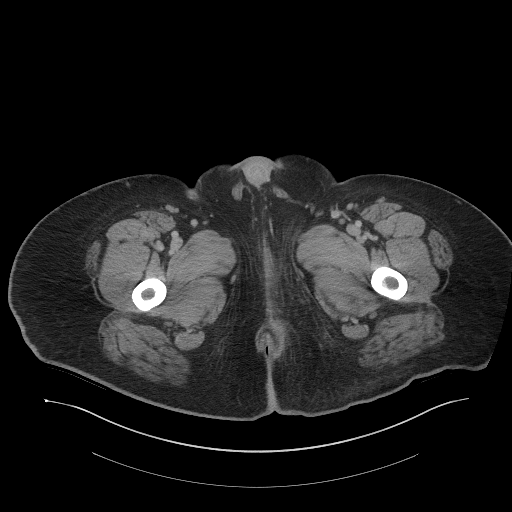
[im 30/123  soft-tissue]
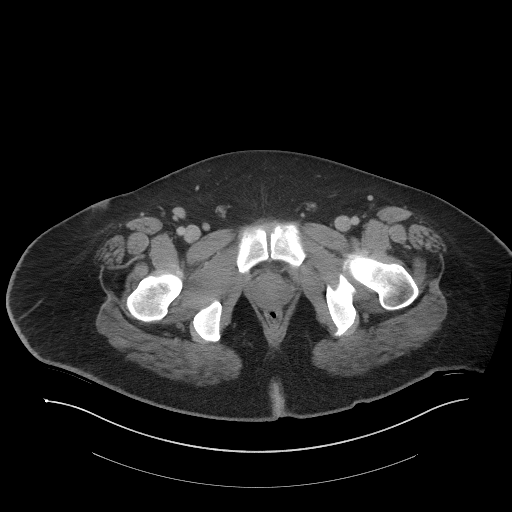
[im 40/123  soft-tissue]
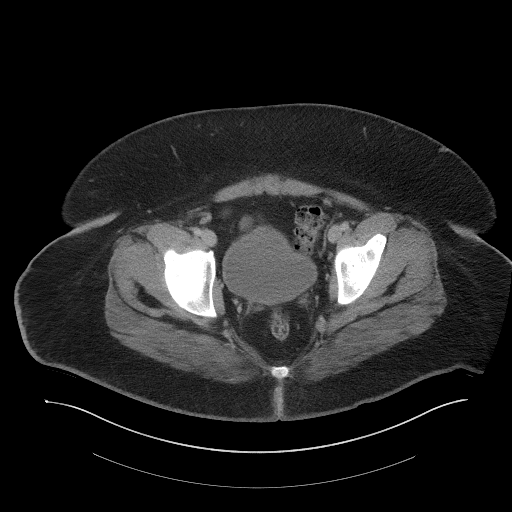
[im 49/123  soft-tissue]
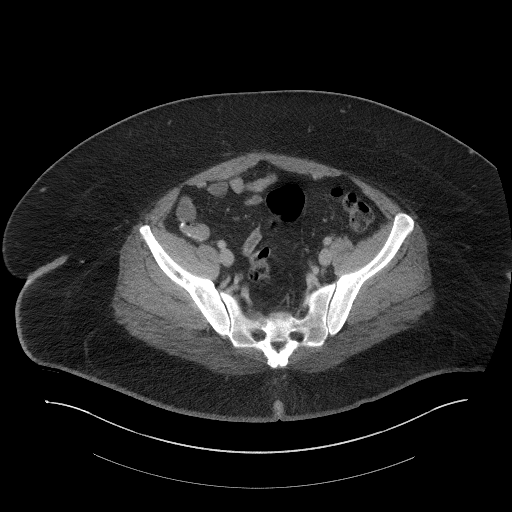
[im 59/123  soft-tissue]
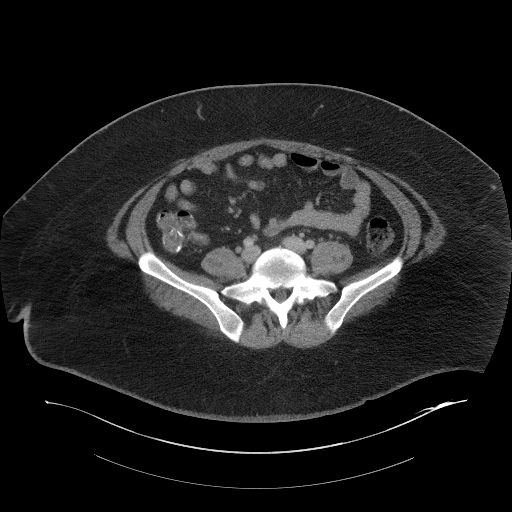
[im 69/123  soft-tissue]
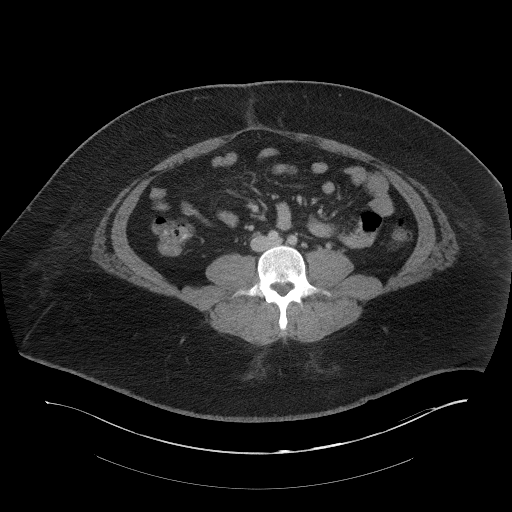
[im 79/123  soft-tissue]
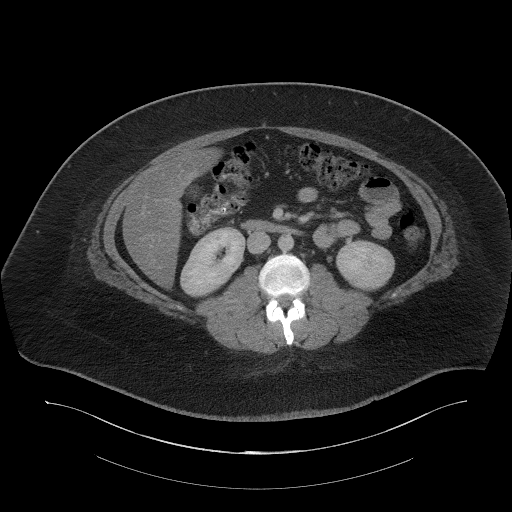
[im 88/123  soft-tissue]
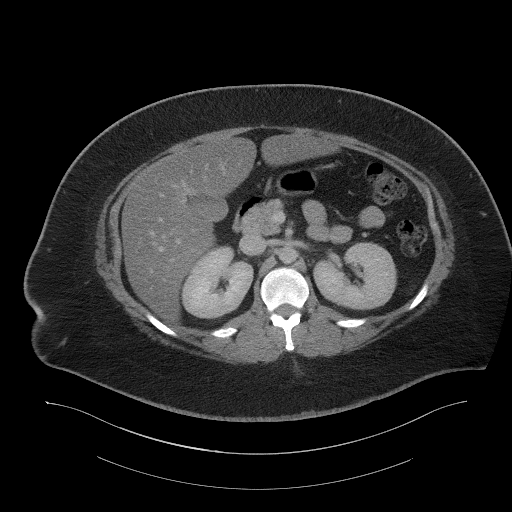
[im 88/123  bone]
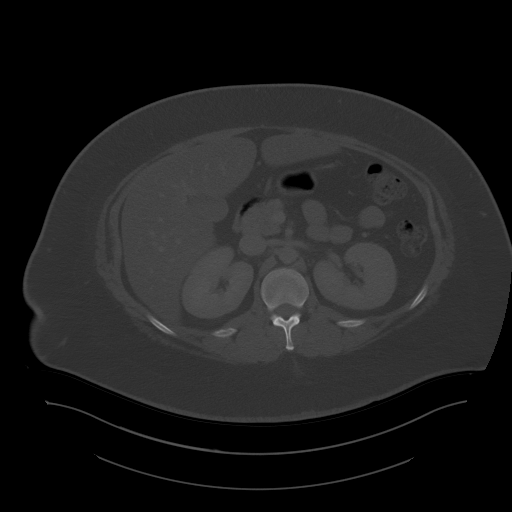
[im 98/123  soft-tissue]
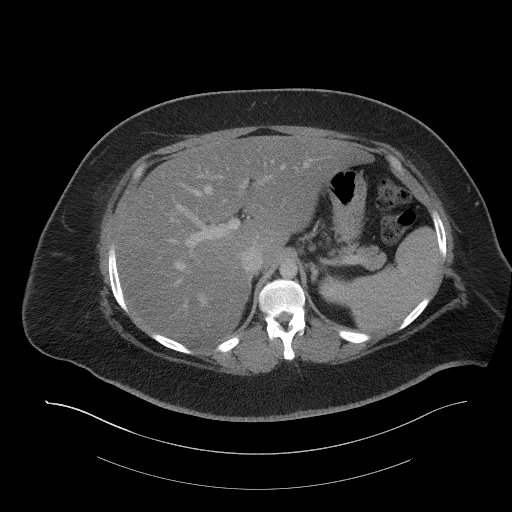
[im 108/123  soft-tissue]
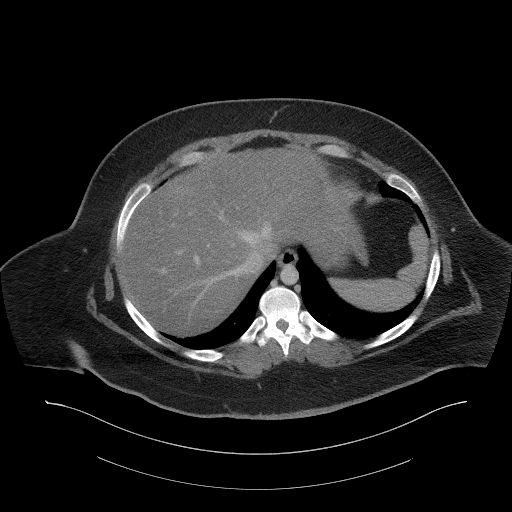
[im 118/123  soft-tissue]
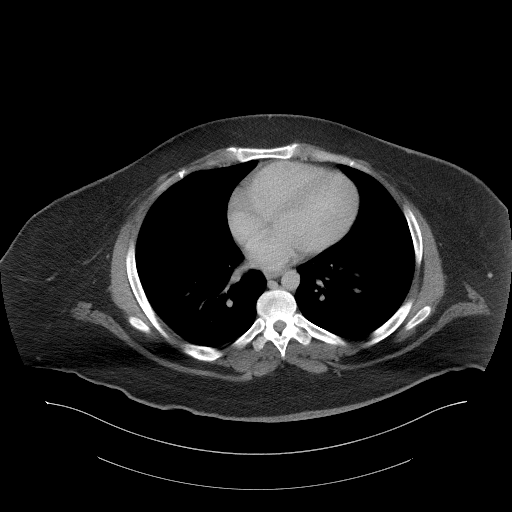

[Series 6: a/p w/ cor · coronal · 0.85mm/px · 3 of 174 slices shown]
[im 58/174  soft-tissue]
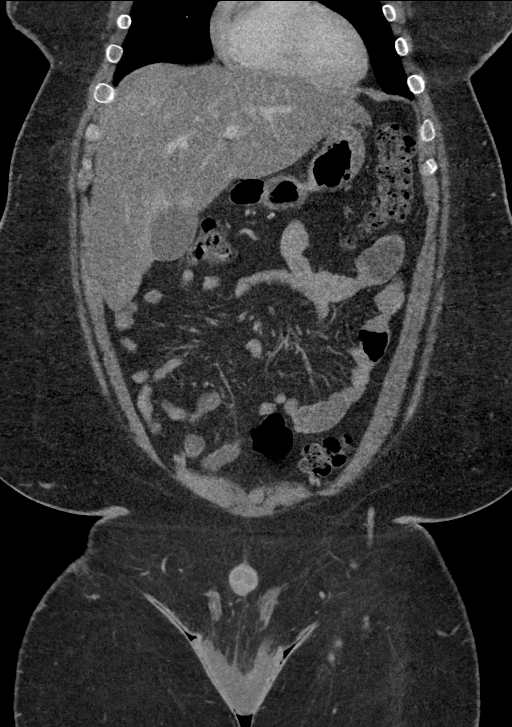
[im 77/174  soft-tissue]
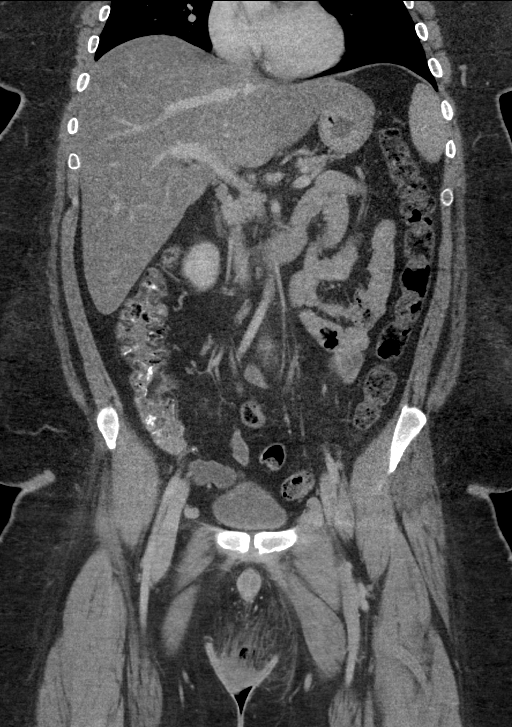
[im 97/174  soft-tissue]
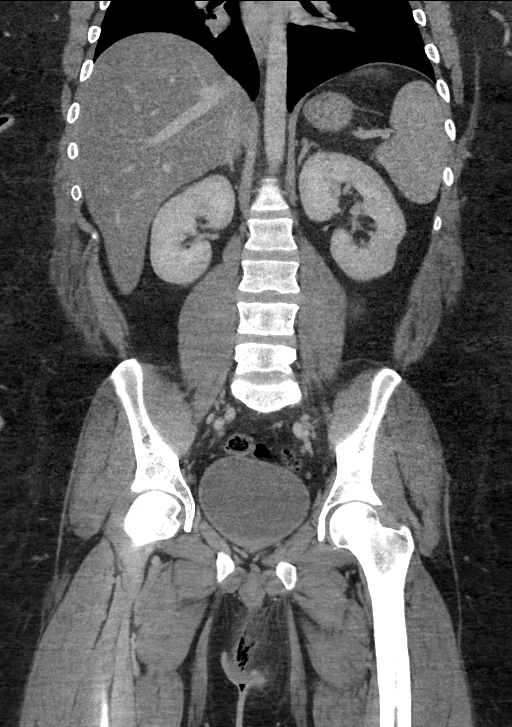

[15 of 46 positions shown; findings below may reference images not displayed]

FINDINGS: Lower chest: The lung bases are clear without focal nodule, mass, or
airspace disease. Heart size is normal. No significant pleural or
pericardial effusion is present.

Hepatobiliary: Diffuse fatty infiltration liver is present. Layering
high density fluid is present in the gallbladder. No stones are
inflammatory changes are present. Common bile duct is within normal
limits.

Pancreas: Unremarkable. No pancreatic ductal dilatation or
surrounding inflammatory changes.

Spleen: Normal in size without focal abnormality.

Adrenals/Urinary Tract: Adrenal glands are normal bilaterally.
Kidneys and ureters are within normal limits. No stone or mass
lesion is present. No obstruction is present. Ureters and urinary
bladder are within normal limits.

Stomach/Bowel: Stomach hand duodenum are within normal limits. Small
bowel is unremarkable. Terminal ileum is normal. The appendix and
scratched at the appendix is visualized and within normal limits.
The ascending and transverse colon are normal. Descending and
sigmoid colon are normal.

Vascular/Lymphatic: No significant vascular findings are present. No
enlarged abdominal or pelvic lymph nodes.

Reproductive: Prostate gland is unremarkable.

Other: Extensive gas is present in the perineum, left of midline.
Gas is localized in the perineum with minimal extension to the
perirectal area. Gas and inflammatory changes extend into the base
of the scrotum. No significant changes are present on the right. No
intraperitoneal inflammation or DVT is present. No free fluid is
present within the peritoneal cavity.

Musculoskeletal: Vertebral body heights and alignment are normal. No
focal lytic or blastic lesions are present. Hips are located and
within normal limits.
IMPRESSION: 1. Extensive gas and inflammatory changes in the perineum, left of
midline compatible with necrotizing fasciitis.
2. Gas and inflammatory changes extend into the base of the scrotum.
3. No intraperitoneal inflammation or DVT is present.
4. Hepatic steatosis.
5. Layering high density fluid in the gallbladder likely represents
sludge. No acute cholecystitis is present.

Critical Value/emergent results were called by telephone at the time
of interpretation on 07/04/2020 at [DATE] to provider Andriqn Lj,
who verbally acknowledged these results.

## 2021-09-26 ENCOUNTER — Other Ambulatory Visit (HOSPITAL_COMMUNITY): Payer: Self-pay

## 2021-10-14 ENCOUNTER — Ambulatory Visit: Payer: Self-pay | Admitting: Internal Medicine

## 2021-10-14 ENCOUNTER — Encounter: Payer: Self-pay | Admitting: Internal Medicine

## 2021-10-14 ENCOUNTER — Other Ambulatory Visit (HOSPITAL_COMMUNITY): Payer: Self-pay

## 2021-10-14 ENCOUNTER — Other Ambulatory Visit: Payer: Self-pay

## 2021-10-14 VITALS — BP 143/86 | HR 105 | Temp 98.9°F | Ht 67.0 in | Wt 338.4 lb

## 2021-10-14 DIAGNOSIS — H9203 Otalgia, bilateral: Secondary | ICD-10-CM

## 2021-10-14 DIAGNOSIS — H6690 Otitis media, unspecified, unspecified ear: Secondary | ICD-10-CM

## 2021-10-14 HISTORY — DX: Otitis media, unspecified, unspecified ear: H66.90

## 2021-10-14 MED ORDER — AMOXICILLIN-POT CLAVULANATE 500-125 MG PO TABS
1.0000 | ORAL_TABLET | Freq: Three times a day (TID) | ORAL | 0 refills | Status: DC
  Filled 2021-10-14: qty 22, 8d supply, fill #0

## 2021-10-14 MED ORDER — AMOXICILLIN-POT CLAVULANATE 500-125 MG PO TABS
1.0000 | ORAL_TABLET | Freq: Three times a day (TID) | ORAL | 0 refills | Status: DC
  Filled 2021-10-14: qty 24, 8d supply, fill #0

## 2021-10-14 NOTE — Assessment & Plan Note (Addendum)
The patient is here today with complaints of bilateral ear pain.  He says this started 2 days ago, which time he was feeling congested and had a "fullness" around his facial area.  The following day, he had right ear pain and yellow discharge coming from the ear.  Now, the right ear pain has gone away, and he now has left ear pain.  He has been coughing up some mucus.  Denies sick contacts, no fevers, no shortness of breath.  He has tried Tylenol and ibuprofen but neither of these has helped.   On exam, tympanic membrane on the left ear appears to be bulging.  Also appears to be bulging on the right side, and yellow discharge noted in ear canal.  P: Findings consistent with acute otitis media.  We have also obtained a respiratory panel to assess for COVID, flu, RSV.  We discussed with the patient that it is possible that he has a perforation on the right side same plus to spill out.  We have prescribed a week of Augmentin for the patient.  Patient was advised to not put anything in his ears and to avoid activities such as swimming.  He will follow-up in 1 week for repeat evaluation.

## 2021-10-14 NOTE — Addendum Note (Signed)
Addended by: Jaci Standard on: 10/14/2021 05:06 PM   Modules accepted: Orders

## 2021-10-14 NOTE — Patient Instructions (Addendum)
Thank you, [name] for allowing [place] to provide your care today. Today we discussed your ear pain.  1) We are giving you a week of antibiotics. Please take this as prescribed.    2) You can take Flonase for your nasal congestion. This can be obtained over the counter. Please take this as labeled on the package.   3) Follow-up in 1 week to see how you are doing after the antibitiocs  I have ordered the following labs for you:  Lab Orders         COVID-19, Flu A+B and RSV        Referrals ordered today:   Referral Orders  No referral(s) requested today     I have ordered the following medication/changed the following medications:   Stop the following medications: Medications Discontinued During This Encounter  Medication Reason   amoxicillin-clavulanate (AUGMENTIN) 500-125 MG tablet      Start the following medications: Meds ordered this encounter  Medications   DISCONTD: amoxicillin-clavulanate (AUGMENTIN) 500-125 MG tablet    Sig: Take 1 tablet (500 mg total) by mouth 3 (three) times daily.    Dispense:  24 tablet    Refill:  0   amoxicillin-clavulanate (AUGMENTIN) 500-125 MG tablet    Sig: Take 1 tablet (500 mg total) by mouth 3 (three) times daily.    Dispense:  22 tablet    Refill:  0     Follow up:  1 week     Should you have any questions or concerns please call the internal medicine clinic at [phone].

## 2021-10-14 NOTE — Progress Notes (Signed)
   CC: ear ache  HPI:  Scott Logan is a 25 y.o. with past medical history as noted below who presents to the clinic today for bilateral ear pain. Please see problem-based list for further details, assessments, and plans.   Past Medical History:  Diagnosis Date   Asthma    Diabetes (HCC)    Hypertension    Review of Systems:  Review of Systems  Constitutional:  Negative for fever.  HENT:  Positive for congestion, ear discharge, ear pain and hearing loss. Negative for sore throat.   Eyes: Negative.   Respiratory:  Positive for cough. Negative for shortness of breath.   Cardiovascular: Negative.   Gastrointestinal: Negative.   Genitourinary: Negative.   Musculoskeletal: Negative.   Skin: Negative.   Neurological: Negative.   Endo/Heme/Allergies: Negative.   Psychiatric/Behavioral: Negative.      Physical Exam:  Vitals:   10/14/21 1542  BP: (!) 143/86  Pulse: (!) 105  Temp: 98.9 F (37.2 C)  TempSrc: Oral  SpO2: 97%  Weight: (!) 338 lb 6.4 oz (153.5 kg)  Height: 5\' 7"  (1.702 m)   Physical Exam Constitutional:      General: He is not in acute distress. HENT:     Head: Normocephalic and atraumatic.     Ears:     Comments: Left ear: Bulging tympanic membrane Right ear: Bulging tympanic membrane and erythema.  There is yellow discharge noted in the ear canal. External ears are otherwise normal bilaterally.    Nose: Congestion present.     Mouth/Throat:     Mouth: Mucous membranes are dry.     Pharynx: No oropharyngeal exudate or posterior oropharyngeal erythema.  Eyes:     Extraocular Movements: Extraocular movements intact.     Pupils: Pupils are equal, round, and reactive to light.  Cardiovascular:     Rate and Rhythm: Regular rhythm. Tachycardia present.     Heart sounds: No murmur heard.   No friction rub. No gallop.  Pulmonary:     Effort: Pulmonary effort is normal.     Breath sounds: Normal breath sounds. No wheezing, rhonchi or rales.   Abdominal:     General: Abdomen is flat. There is no distension.  Musculoskeletal:        General: Normal range of motion.  Skin:    General: Skin is warm and dry.  Neurological:     General: No focal deficit present.     Mental Status: He is alert and oriented to person, place, and time. Mental status is at baseline.  Psychiatric:        Mood and Affect: Mood normal.        Behavior: Behavior normal.     Assessment & Plan:   See Encounters Tab for problem based charting.  Patient seen with Dr.  

## 2021-10-15 LAB — COVID-19, FLU A+B AND RSV
Influenza A, NAA: NOT DETECTED
Influenza B, NAA: NOT DETECTED
RSV, NAA: NOT DETECTED
SARS-CoV-2, NAA: NOT DETECTED

## 2021-10-15 NOTE — Progress Notes (Signed)
Internal Medicine Clinic Attending  I saw and evaluated the patient.  I personally confirmed the key portions of the history and exam documented by Dr. Alroy Bailiff and I reviewed pertinent patient test results.  The assessment, diagnosis, and plan were formulated together and I agree with the documentation in the resident's note. Left TM appears opaque along the inferior portion with bulging. Right TM difficult to fully visualize given purulent material in the canal, however visualized area appears erythematous. Patient reported pain relief upon noticing purulent drainage from right ear, concerning for acute otitis media with TM perforation. Possible left AOM as well given physical exam findings. Will treat for AOM and discussed nasal therapies, provided strict return precautions and ear hygiene. Will schedule follow-up in one week to re-assess.

## 2021-10-23 ENCOUNTER — Encounter: Payer: Medicaid Other | Admitting: Student

## 2021-10-24 ENCOUNTER — Telehealth: Payer: Self-pay

## 2021-10-24 NOTE — Telephone Encounter (Signed)
Pt states he still have ear pain, requesting to speak with a nurse about getting refill on amoxicillin-clavulanate (AUGMENTIN) 500-125 MG tablet.

## 2021-10-24 NOTE — Telephone Encounter (Signed)
Returned call to patient. States he woke up this morning and his ear was stopped up again. He is worried that the infection will come back. He feels fine at present. Denies pain, fever/chills. Has not p/u flonase nasal spray but will today.

## 2021-10-29 ENCOUNTER — Other Ambulatory Visit: Payer: Self-pay

## 2021-10-30 ENCOUNTER — Other Ambulatory Visit (HOSPITAL_COMMUNITY): Payer: Self-pay

## 2021-10-30 ENCOUNTER — Encounter: Payer: Medicaid Other | Admitting: Student

## 2021-10-30 ENCOUNTER — Telehealth: Payer: Self-pay | Admitting: *Deleted

## 2021-10-30 NOTE — Telephone Encounter (Signed)
   Call placed to patient for today's missed appt. No answer. Left message on VM requesting return call to rescheduled appt.Kingsley Spittle Cassady12/8/202211:29 AM

## 2021-12-05 ENCOUNTER — Other Ambulatory Visit (HOSPITAL_COMMUNITY): Payer: Self-pay

## 2022-01-02 ENCOUNTER — Other Ambulatory Visit: Payer: Self-pay | Admitting: Internal Medicine

## 2022-01-02 ENCOUNTER — Other Ambulatory Visit (HOSPITAL_COMMUNITY): Payer: Self-pay

## 2022-01-02 DIAGNOSIS — E119 Type 2 diabetes mellitus without complications: Secondary | ICD-10-CM

## 2022-01-02 MED ORDER — LANTUS SOLOSTAR 100 UNIT/ML ~~LOC~~ SOPN
40.0000 [IU] | PEN_INJECTOR | Freq: Every day | SUBCUTANEOUS | 0 refills | Status: DC
  Filled 2022-01-02: qty 12, 30d supply, fill #0

## 2022-01-05 ENCOUNTER — Other Ambulatory Visit (HOSPITAL_COMMUNITY): Payer: Self-pay

## 2022-01-14 ENCOUNTER — Other Ambulatory Visit (HOSPITAL_COMMUNITY): Payer: Self-pay

## 2022-01-26 ENCOUNTER — Encounter: Payer: Self-pay | Admitting: *Deleted

## 2022-02-11 ENCOUNTER — Other Ambulatory Visit: Payer: Self-pay | Admitting: Internal Medicine

## 2022-02-11 ENCOUNTER — Other Ambulatory Visit (HOSPITAL_COMMUNITY): Payer: Self-pay

## 2022-02-11 DIAGNOSIS — E119 Type 2 diabetes mellitus without complications: Secondary | ICD-10-CM

## 2022-02-11 NOTE — Telephone Encounter (Signed)
Next appt scheduled 02/24/22 with PCP. ?

## 2022-02-12 ENCOUNTER — Other Ambulatory Visit (HOSPITAL_COMMUNITY): Payer: Self-pay

## 2022-02-12 MED ORDER — LANTUS SOLOSTAR 100 UNIT/ML ~~LOC~~ SOPN
40.0000 [IU] | PEN_INJECTOR | Freq: Every day | SUBCUTANEOUS | 0 refills | Status: DC
  Filled 2022-02-12: qty 12, 30d supply, fill #0

## 2022-02-24 ENCOUNTER — Encounter: Payer: Medicaid Other | Admitting: Internal Medicine

## 2022-03-04 ENCOUNTER — Encounter: Payer: Self-pay | Admitting: Internal Medicine

## 2022-03-04 ENCOUNTER — Other Ambulatory Visit (HOSPITAL_COMMUNITY): Payer: Self-pay

## 2022-03-04 ENCOUNTER — Ambulatory Visit: Payer: Self-pay | Admitting: Internal Medicine

## 2022-03-04 VITALS — BP 140/91 | HR 110 | Temp 97.9°F | Ht 67.0 in | Wt 348.7 lb

## 2022-03-04 DIAGNOSIS — Z6841 Body Mass Index (BMI) 40.0 and over, adult: Secondary | ICD-10-CM

## 2022-03-04 DIAGNOSIS — Z5989 Other problems related to housing and economic circumstances: Secondary | ICD-10-CM

## 2022-03-04 DIAGNOSIS — E119 Type 2 diabetes mellitus without complications: Secondary | ICD-10-CM

## 2022-03-04 DIAGNOSIS — I10 Essential (primary) hypertension: Secondary | ICD-10-CM

## 2022-03-04 LAB — POCT GLYCOSYLATED HEMOGLOBIN (HGB A1C): Hemoglobin A1C: 9.5 % — AB (ref 4.0–5.6)

## 2022-03-04 LAB — GLUCOSE, CAPILLARY: Glucose-Capillary: 320 mg/dL — ABNORMAL HIGH (ref 70–99)

## 2022-03-04 MED ORDER — LISINOPRIL 5 MG PO TABS
5.0000 mg | ORAL_TABLET | Freq: Every day | ORAL | 11 refills | Status: DC
Start: 2022-03-04 — End: 2022-04-01
  Filled 2022-03-04: qty 30, 30d supply, fill #0

## 2022-03-04 MED ORDER — INSULIN PEN NEEDLE 32G X 4 MM MISC
3 refills | Status: DC
  Filled 2022-03-04 – 2022-04-16 (×3): qty 100, 25d supply, fill #0
  Filled 2022-06-08: qty 100, 50d supply, fill #1
  Filled 2022-08-06: qty 100, 50d supply, fill #2
  Filled 2022-10-20: qty 100, 50d supply, fill #3

## 2022-03-04 MED ORDER — LIRAGLUTIDE 18 MG/3ML ~~LOC~~ SOPN
0.6000 mg | PEN_INJECTOR | Freq: Every day | SUBCUTANEOUS | 3 refills | Status: DC
  Filled 2022-03-04: qty 6, 30d supply, fill #0

## 2022-03-04 MED ORDER — METFORMIN HCL 1000 MG PO TABS
1000.0000 mg | ORAL_TABLET | Freq: Two times a day (BID) | ORAL | 5 refills | Status: DC
  Filled 2022-03-04 – 2022-03-17 (×2): qty 60, 30d supply, fill #0
  Filled 2022-04-16: qty 60, 30d supply, fill #1
  Filled 2022-05-13: qty 60, 30d supply, fill #2
  Filled 2022-06-19: qty 60, 30d supply, fill #3
  Filled 2022-07-24: qty 60, 30d supply, fill #4
  Filled 2022-08-18: qty 60, 30d supply, fill #5

## 2022-03-04 MED ORDER — LANTUS SOLOSTAR 100 UNIT/ML ~~LOC~~ SOPN
40.0000 [IU] | PEN_INJECTOR | Freq: Every day | SUBCUTANEOUS | 0 refills | Status: DC
  Filled 2022-03-04 – 2022-03-13 (×2): qty 12, 30d supply, fill #0

## 2022-03-04 NOTE — Assessment & Plan Note (Signed)
>>  ASSESSMENT AND PLAN FOR BMI 45.0-49.9, ADULT (HCC) WRITTEN ON 03/04/2022 11:29 AM BY Krikor Willet N, DO  Weight is up 10 lbs from last office visit in November- BMI now up to 54. Patient notes that he often wakes up feeling tired and that his girlfriend has observed him snoring and having apneic episodes during the night. He is concerned he may have sleep apnea. STOP-Bang score 6 (snore, tired, apnea, hypertension, BMI, and male).  Discussed getting a sleep study done, however, this would be cost prohibitive at this time, as the patient does not have insurance. Referred patient to our social work team to assist patient with obtaining Medicaid vs [place] card.

## 2022-03-04 NOTE — Assessment & Plan Note (Signed)
Weight is up 10 lbs from last office visit in November- BMI now up to 54. Patient notes that he often wakes up feeling tired and that his girlfriend has observed him snoring and having apneic episodes during the night. He is concerned he may have sleep apnea. STOP-Bang score 6 (snore, tired, apnea, hypertension, BMI, and male).  ?Discussed getting a sleep study done, however, this would be cost prohibitive at this time, as the patient does not have insurance. Referred patient to our social work team to assist patient with obtaining Medicaid vs [place] card.  ?

## 2022-03-04 NOTE — Progress Notes (Signed)
Internal Medicine Clinic Attending ° °Case discussed with Dr. Atway  At the time of the visit.  We reviewed the resident’s history and exam and pertinent patient test results.  I agree with the assessment, diagnosis, and plan of care documented in the resident’s note.  °

## 2022-03-04 NOTE — Assessment & Plan Note (Signed)
A1c has been increasing over the last year, from 6.7 to 8 in September and now, up to 9.5. Patient notes that he has been less strict with his diet because he got "comfortable" with his diabetes and let things loose. He has still been taking his metformin 1 g bid and lantus 40u qhs. He was started on Victoza 0.6 mg daily at his last visit in September, however, he states that he does not remember how he did with this medication and he did not follow up after to increase its dose (has not taken it since then). Will restart Victoza today, with the goal of increasing its dose in 4 weeks (stressed importance of patient making follow up appt). Patient was very anxious today about his diabetes and he understands the many complications of diabetes if he is not able to get his A1c under tighter control.  ? ?Plan: ?- Continue lantus 40u qhs ?- Continue metformin 1 g bid ?- Restart Victoza 0.6 mg daily; increase dose in 4 weeks if tolerating well ?- Referral to Diabetes and Nutrition Services for [facility] ?- Advised patient to check sugars at least once daily and bring in CBG log to next appt ?

## 2022-03-04 NOTE — Assessment & Plan Note (Signed)
BP elevated to 146/95 initially, upon recheck 140/91. Patient denies any headaches, dizziness, blurry vision, chest pain, or palpitations. He states that he has not been taking his lisinopril 2.5 mg, as the pharmacy gave him new tablets which are too small to cut in half. He states that because of this, he hasn't taken it in over 3 weeks and before that, he was not taking it consistently. Will increase lisinopril today so the patient does not have to cut pill in half. ? ?Plan: ?- Increase lisinopril to 5 mg daily  ?

## 2022-03-04 NOTE — Patient Instructions (Signed)
Thank you, [name] for allowing [place] to provide your care today. Today we discussed: ? ?Diabetes: Continue taking Lantus 40 units each day, metformin 1 g twice daily, and re-start Victoza at 0.6 mg per day. Your A1c has been increasing since September and if we do not get it back to less than 7, you will be at a higher risk of having heart attacks, strokes, needing amputations, or even needing dialysis. We will recheck your sugar in 4 weeks and will likely increase your Victoza at that time ? ?High blood pressure: Start taking lisinopril 5 mg daily (full tablet) ? ?Sleep apnea: You are at high risk for sleep apnea. Once you are able to get Medicaid or the [facility], we will send in a referral to get a sleep study performed ? ?I have ordered the following labs for you: ? ?Lab Orders    ?     Glucose, capillary    ?     POC Hbg A1C     ? ?Tests ordered today: ? ?none ? ?Referrals ordered today:  ? ?Referral Orders    ?     AMB Referral to [facility]    ?     Referral to Nutrition and Diabetes Services     ? ?I have ordered the following medication/changed the following medications:  ? ?Stop the following medications: ?Medications Discontinued During This Encounter  ?Medication Reason  ? amoxicillin-clavulanate (AUGMENTIN) 500-125 MG tablet   ? metFORMIN (GLUCOPHAGE) 1000 MG tablet Reorder  ? Insulin Pen Needle 32G X 4 MM MISC Reorder  ? liraglutide (VICTOZA) 18 MG/3ML SOPN Reorder  ? lisinopril (ZESTRIL) 5 MG tablet Reorder  ? insulin glargine (LANTUS SOLOSTAR) 100 UNIT/ML Solostar Pen Reorder  ?  ? ?Start the following medications: ?Meds ordered this encounter  ?Medications  ? lisinopril (ZESTRIL) 5 MG tablet  ?  Sig: Take 1 tablet (5 mg total) by mouth daily.  ?  Dispense:  30 tablet  ?  Refill:  11  ?  IM Program  ? insulin glargine (LANTUS SOLOSTAR) 100 UNIT/ML Solostar Pen  ?  Sig: Inject 40 Units into the skin daily.  ?  Dispense:  12 mL  ?  Refill:  0  ?  IM PROGRAM  ? Insulin Pen  Needle 32G X 4 MM MISC  ?  Sig: USE AS DIRECTED TO INJECT INSULIN FOUR TIMES DAILY.  ?  Dispense:  100 each  ?  Refill:  3  ?  IM program  ? metFORMIN (GLUCOPHAGE) 1000 MG tablet  ?  Sig: Take 1 tablet (1,000 mg total) by mouth 2 (two) times daily with a meal.  ?  Dispense:  60 tablet  ?  Refill:  5  ?  IM program  ? liraglutide (VICTOZA) 18 MG/3ML SOPN  ?  Sig: Inject 0.6 mg into the skin daily.  ?  Dispense:  3 mL  ?  Refill:  3  ?  IM program  ?  ? ?Follow up:  4 weeks for diabetes follow up   ? ? ?Remember: To start taking Victoza and come back in 4 weeks so we can increase the dose ? ?Should you have any questions or concerns please call the internal medicine clinic at [phone].   ? ? ?Elza Rafter, D.O. ?[facility] ? ? ?

## 2022-03-04 NOTE — Progress Notes (Signed)
? ?Established Patient Office Visit ? ?Subjective:  ?Patient ID: Scott Logan, male    DOB: Jun 02, 1996  Age: 26 y.o. MRN: [id number] ? ?CC: Diabetes follow up ? ? ?HPI ?Scott Logan is a 26 yo male with diabetes, hypertension, and asthma who presents for follow up of his diabetes. Please see problem-based list for further details, assessments, and plans. ? ? ?Past Medical History:  ?Diagnosis Date  ? Asthma   ? Diabetes ([place])   ? Hypertension   ? ? ?Past Surgical History:  ?Procedure Laterality Date  ? DRESSING CHANGE UNDER ANESTHESIA N/A 07/06/2020  ? Procedure: DRESSING CHANGE UNDER ANESTHESIA WITH WASH-OUT OF PERINEUM;  Surgeon: Ralene Ok, MD;  Location: [facility];  Service: General;  Laterality: N/A;  ? [place] N/A 07/04/2020  ? Procedure: IRRIGATION AND DEBRIDEMENT PERINEAL ABSCESS;  Surgeon: Rolm Bookbinder, MD;  Location: [facility];  Service: General;  Laterality: N/A;  ? TONSILLECTOMY    ? ? ?Family History  ?Problem Relation Age of Onset  ? Diabetes Sister   ? ? ?Social History  ? ?Socioeconomic History  ? Marital status: Single  ?  Spouse name: Not on file  ? Number of children: Not on file  ? Years of education: Not on file  ? Highest education level: Not on file  ?Occupational History  ? Occupation: Photographer  ?Tobacco Use  ? Smoking status: Never  ? Smokeless tobacco: Never  ?Vaping Use  ? Vaping Use: Never used  ?Substance and Sexual Activity  ? Alcohol use: Never  ? Drug use: Never  ? Sexual activity: Yes  ?  Birth control/protection: Condom, Coitus interruptus  ?Other Topics Concern  ? Not on file  ?Social History Narrative  ? Not on file  ? ?Social Determinants of Health  ? ?Financial Resource Strain: Not on file  ?Food Insecurity: Not on file  ?Transportation Needs: Not on file  ?Physical Activity: Not on file  ?Stress: Not on file  ?Social Connections: Not on file  ?Intimate Partner Violence: Not on file  ? ? ?Outpatient Medications Prior to Visit   ?Medication Sig Dispense Refill  ? blood glucose meter kit and supplies KIT Dispense based on patient and insurance preference. Use up to four times daily as directed. (FOR ICD-9 250.00, 250.01). 1 each 0  ? Continuous Blood Gluc Sensor (FREESTYLE LIBRE 2 SENSOR) MISC Use to check blood sugar at least 6 times a day 2 each 12  ? nystatin (MYCOSTATIN/NYSTOP) powder Apply topically 3 (three) times daily. 60 g 0  ? amoxicillin-clavulanate (AUGMENTIN) 500-125 MG tablet Take 1 tablet (500 mg total) by mouth 3 (three) times daily. 22 tablet 0  ? insulin glargine (LANTUS SOLOSTAR) 100 UNIT/ML Solostar Pen Inject 40 Units into the skin daily. 12 mL 0  ? Insulin Pen Needle 32G X 4 MM MISC USE AS DIRECTED TO INJECT INSULIN FOUR TIMES DAILY. 100 each 3  ? liraglutide (VICTOZA) 18 MG/3ML SOPN Inject 0.6 mg into the skin daily. 3 mL 3  ? lisinopril (ZESTRIL) 5 MG tablet Take 0.5 tablets (2.5 mg total) by mouth daily. 30 tablet 11  ? metFORMIN (GLUCOPHAGE) 1000 MG tablet Take 1 tablet (1,000 mg total) by mouth 2 (two) times daily with a meal. 60 tablet 5  ? ?No facility-administered medications prior to visit.  ? ? ?No Known Allergies ? ?ROS ?Review of Systems  ?Constitutional:  Negative for chills and fever.  ?HENT: Negative.    ?Eyes:  Negative for visual  disturbance.  ?Respiratory:  Negative for cough and shortness of breath.   ?Cardiovascular:  Negative for chest pain.  ?Gastrointestinal:  Negative for abdominal pain, constipation, diarrhea, nausea and vomiting.  ?Genitourinary:  Negative for dysuria and hematuria.  ?Neurological:  Negative for dizziness and headaches.  ? ?  ?Objective:  ?  ?General: Pleasant, well-appearing obese male. No acute distress. ?CV: Tachycardic rate, regular rhythm. No murmurs. ?Pulmonary: Lungs CTAB. Normal effort. No wheezing or rales. ?Abdominal: Soft, nontender, nondistended. Normal bowel sounds. ?Skin: Warm and dry.  ?Neuro: A&Ox3. No focal deficit. ?Psych: Anxious mood and affect ? ? ?BP (!)  140/91 (BP Location: Left Arm, Cuff Size: Large)   Pulse (!) 110   Temp 97.9 ?F (36.6 ?C) (Oral)   Ht 5' 7"  (1.702 m)   Wt (!) 348 lb 11.2 oz (158.2 kg)   SpO2 94%   BMI 54.61 kg/m?  ?Wt Readings from Last 3 Encounters:  ?03/04/22 (!) 348 lb 11.2 oz (158.2 kg)  ?10/14/21 (!) 338 lb 6.4 oz (153.5 kg)  ?08/12/21 (!) 319 lb 4.8 oz (144.8 kg)  ? ? ? ?Health Maintenance Due  ?Topic Date Due  ? OPHTHALMOLOGY EXAM  Never done  ? HPV VACCINES (1 - Male 2-dose series) Never done  ? Hepatitis C Screening  Never done  ? COVID-19 Vaccine (3 - Booster for Pfizer series) 08/30/2020  ? ? ?   ?Topic Date Due  ? HPV VACCINES (1 - Male 2-dose series) Never done  ? ? ?No results found for: TSH ?Lab Results  ?Component Value Date  ? WBC 10.1 08/21/2020  ? HGB 13.3 08/21/2020  ? HCT 42.0 08/21/2020  ? MCV 80 08/21/2020  ? PLT 333 08/21/2020  ? ?Lab Results  ?Component Value Date  ? NA 137 08/21/2020  ? K 5.0 08/21/2020  ? CO2 20 08/21/2020  ? GLUCOSE 200 (H) 08/21/2020  ? BUN 17 08/21/2020  ? CREATININE 0.87 08/21/2020  ? BILITOT 0.7 07/03/2020  ? ALKPHOS 59 07/03/2020  ? AST 15 07/03/2020  ? ALT 26 07/03/2020  ? PROT 7.9 07/03/2020  ? ALBUMIN 3.1 (L) 07/03/2020  ? CALCIUM 8.8 08/21/2020  ? ANIONGAP 10 07/09/2020  ? ?Lab Results  ?Component Value Date  ? CHOL 178 08/21/2020  ? ?Lab Results  ?Component Value Date  ? HDL 32 (L) 08/21/2020  ? ?Lab Results  ?Component Value Date  ? LDLCALC 112 (H) 08/21/2020  ? ?Lab Results  ?Component Value Date  ? TRIG 195 (H) 08/21/2020  ? ?Lab Results  ?Component Value Date  ? CHOLHDL 5.6 (H) 08/21/2020  ? ?Lab Results  ?Component Value Date  ? HGBA1C 9.5 (A) 03/04/2022  ? ? ?  ?Assessment & Plan:  ? ?Problem List Items Addressed This Visit   ? ?  ? Cardiovascular and Mediastinum  ? Hypertension  ?  BP elevated to 146/95 initially, upon recheck 140/91. Patient denies any headaches, dizziness, blurry vision, chest pain, or palpitations. He states that he has not been taking his lisinopril 2.5 mg,  as the pharmacy gave him new tablets which are too small to cut in half. He states that because of this, he hasn't taken it in over 3 weeks and before that, he was not taking it consistently. Will increase lisinopril today so the patient does not have to cut pill in half. ? ?Plan: ?- Increase lisinopril to 5 mg daily  ?  ?  ? Relevant Medications  ? lisinopril (ZESTRIL) 5 MG tablet  ?  ?  Endocrine  ? Diabetes ([place]) - Primary  ?  A1c has been increasing over the last year, from 6.7 to 8 in September and now, up to 9.5. Patient notes that he has been less strict with his diet because he got "comfortable" with his diabetes and let things loose. He has still been taking his metformin 1 g bid and lantus 40u qhs. He was started on Victoza 0.6 mg daily at his last visit in September, however, he states that he does not remember how he did with this medication and he did not follow up after to increase its dose (has not taken it since then). Will restart Victoza today, with the goal of increasing its dose in 4 weeks (stressed importance of patient making follow up appt). Patient was very anxious today about his diabetes and he understands the many complications of diabetes if he is not able to get his A1c under tighter control.  ? ?Plan: ?- Continue lantus 40u qhs ?- Continue metformin 1 g bid ?- Restart Victoza 0.6 mg daily; increase dose in 4 weeks if tolerating well ?- Referral to Diabetes and Nutrition Services for [place] ?- Advised patient to check sugars at least once daily and bring in CBG log to next appt ?  ?  ? Relevant Medications  ? lisinopril (ZESTRIL) 5 MG tablet  ? insulin glargine (LANTUS SOLOSTAR) 100 UNIT/ML Solostar Pen  ? Insulin Pen Needle 32G X 4 MM MISC  ? metFORMIN (GLUCOPHAGE) 1000 MG tablet  ? liraglutide (VICTOZA) 18 MG/3ML SOPN  ? Other Relevant Orders  ? POC Hbg A1C (Completed)  ? Referral to Nutrition and Diabetes Services  ?  ? Other  ? BMI 45.0-49.9, adult ([place])  ?  Weight is up 10  lbs from last office visit in November- BMI now up to 54. Patient notes that he often wakes up feeling tired and that his girlfriend has observed him snoring and having apneic episodes during the night. He is conce

## 2022-03-05 ENCOUNTER — Telehealth: Payer: Self-pay

## 2022-03-05 NOTE — Telephone Encounter (Signed)
? ?  Telephone encounter was:  Unsuccessful.  03/05/2022 ?Name: Scott Logan MRN: [id number] DOB: [birth year] ? ?Unsuccessful outbound call made today to assist with:   health insurance. ? ?Outreach Attempt:  1st Attempt ? ?A HIPAA compliant voice message was left requesting a return call.  Instructed patient to call back at [phone]. ? ?Monroe Qin, AAS Paralegal, CHC ?Care Guide  Embedded Care Coordination ?  Care Management  ?[address] ?[place], [place] [address] ???[email or web address]  ?? [phone]   ?[email or web address] ?  ?

## 2022-03-10 ENCOUNTER — Telehealth: Payer: Self-pay

## 2022-03-10 NOTE — Telephone Encounter (Signed)
? ?  Telephone encounter was:  Unsuccessful.  03/10/2022 ?Name: Scott Logan MRN: [id number] DOB: 03/01/1996 ? ?Unsuccessful outbound call made today to assist with:  Financial Difficulties related to health insurance ? ?Outreach Attempt:  2nd Attempt ? ?A HIPAA compliant voice message was left requesting a return call.  Instructed patient to call back at [phone]. ? ?Pallie Swigert, AAS Paralegal, CHC ?Care Guide  Embedded Care Coordination ?[facility]  Care Management  ?[address] ?[place], [place] [address] ?[email or web address]  ?? [phone]   ?[email or web address] ?  ?

## 2022-03-13 ENCOUNTER — Other Ambulatory Visit (HOSPITAL_COMMUNITY): Payer: Self-pay

## 2022-03-16 ENCOUNTER — Telehealth: Payer: Self-pay

## 2022-03-16 NOTE — Telephone Encounter (Signed)
? ?  Telephone encounter was:  Successful.  ?03/16/2022 ?Name: Scott Logan MRN: [id number] DOB: 02-09-1996 ? ?Scott Logan is a 26 y.o. year old male who is a primary care patient of Atway, Rayann N, DO . The community resource team was consulted for assistance with  health insurance information. ? ?Care guide performed the following interventions: Spoke with patient verified email address and sent information for [facility]. Letter saved in Epic. ? ?Follow Up Plan:  Care guide will follow up with patient by phone over the next 7 days. ? ?Kiyomi Pallo, [place], CHC ?Care Guide  Embedded Care Coordination ?[facility]  Care Management  ?[address] ?[place], [place] [address] ?[email or web address]  ?? [phone]   ?[email or web address] ?  ?

## 2022-03-17 ENCOUNTER — Telehealth: Payer: Self-pay

## 2022-03-17 ENCOUNTER — Other Ambulatory Visit (HOSPITAL_COMMUNITY): Payer: Self-pay

## 2022-03-17 NOTE — Telephone Encounter (Signed)
? ?  Telephone encounter was:  Successful.  ?03/17/2022 ?Name: Scott Logan MRN: [id number] DOB: 1995-12-22 ? ?Scott Logan is a 26 y.o. year old male who is a primary care patient of Atway, Rayann N, DO . The community resource team was consulted for assistance with  health insurance information. ? ?Care guide performed the following interventions: Received email from patient confirming receipt of health information. ? ?Follow Up Plan:  No further follow up planned at this time. The patient has been provided with needed resources. ? ?[name], [name], CHC ?Care Guide  Embedded Care Coordination ?[facility]  Care Management  ?[address] ?[place], [place] [address] ???[email or web address]  ?? [phone]   ?[email or web address] ?  ?

## 2022-04-01 ENCOUNTER — Ambulatory Visit: Payer: Self-pay | Admitting: Student

## 2022-04-01 ENCOUNTER — Other Ambulatory Visit (HOSPITAL_COMMUNITY): Payer: Self-pay

## 2022-04-01 DIAGNOSIS — Z7984 Long term (current) use of oral hypoglycemic drugs: Secondary | ICD-10-CM

## 2022-04-01 DIAGNOSIS — Z6841 Body Mass Index (BMI) 40.0 and over, adult: Secondary | ICD-10-CM

## 2022-04-01 DIAGNOSIS — E1121 Type 2 diabetes mellitus with diabetic nephropathy: Secondary | ICD-10-CM

## 2022-04-01 DIAGNOSIS — I10 Essential (primary) hypertension: Secondary | ICD-10-CM

## 2022-04-01 DIAGNOSIS — E119 Type 2 diabetes mellitus without complications: Secondary | ICD-10-CM

## 2022-04-01 MED ORDER — LIRAGLUTIDE 18 MG/3ML ~~LOC~~ SOPN
1.2000 mg | PEN_INJECTOR | Freq: Every day | SUBCUTANEOUS | 3 refills | Status: DC
  Filled 2022-04-01: qty 6, 30d supply, fill #0
  Filled 2022-05-08: qty 6, 30d supply, fill #1
  Filled 2022-06-08: qty 6, 30d supply, fill #2

## 2022-04-01 MED ORDER — LIRAGLUTIDE 18 MG/3ML ~~LOC~~ SOPN
1.2000 mg | PEN_INJECTOR | Freq: Every day | SUBCUTANEOUS | 3 refills | Status: DC
  Filled 2022-04-01: qty 3, 15d supply, fill #0

## 2022-04-01 MED ORDER — LISINOPRIL 10 MG PO TABS
10.0000 mg | ORAL_TABLET | Freq: Every day | ORAL | 2 refills | Status: DC
  Filled 2022-04-01: qty 30, 30d supply, fill #0
  Filled 2022-05-08: qty 30, 30d supply, fill #1
  Filled 2022-06-09: qty 30, 30d supply, fill #2

## 2022-04-01 NOTE — Patient Instructions (Addendum)
Blood pressure ?Increase lisinopril to 10 mg daily ?Follow up in 2 weeks ? ?Diabetes  ?Increase Victoza to 1.2 mg daily ?Continue lantus 40 units daily and metformin  ?

## 2022-04-02 ENCOUNTER — Encounter: Payer: Self-pay | Admitting: Student

## 2022-04-02 DIAGNOSIS — E1121 Type 2 diabetes mellitus with diabetic nephropathy: Secondary | ICD-10-CM | POA: Insufficient documentation

## 2022-04-02 LAB — BMP8+ANION GAP
Anion Gap: 15 mmol/L (ref 10.0–18.0)
BUN/Creatinine Ratio: 16 (ref 9–20)
BUN: 12 mg/dL (ref 6–20)
CO2: 20 mmol/L (ref 20–29)
Calcium: 9.2 mg/dL (ref 8.7–10.2)
Chloride: 103 mmol/L (ref 96–106)
Creatinine, Ser: 0.75 mg/dL — ABNORMAL LOW (ref 0.76–1.27)
Glucose: 193 mg/dL — ABNORMAL HIGH (ref 70–99)
Potassium: 5.1 mmol/L (ref 3.5–5.2)
Sodium: 138 mmol/L (ref 134–144)
eGFR: 128 mL/min/{1.73_m2} (ref >59)

## 2022-04-02 LAB — MICROALBUMIN / CREATININE URINE RATIO
Creatinine, Urine: 103.9 mg/dL
Microalb/Creat Ratio: 2491 mg/g creat — ABNORMAL HIGH (ref 0–29)
Microalbumin, Urine: 2588.2 ug/mL

## 2022-04-02 NOTE — Progress Notes (Signed)
? ?Established Patient Office Visit ? ?Subjective   ?Patient ID: Scott Logan, male    DOB: Jun 03, 1996  Age: 26 y.o. MRN: [id number] ? ?Chief Complaint  ?Patient presents with  ? Diabetes  ?  Here for 1 mth follow up after restarting Victoza last visit- tolerating it well. Still taking metformin and lantus,  ?  ? ? ?Scott Logan is a 26 year old person living with diabetes, hypertension, and obesity who presents to clinic for follow-up of diabetes today. ? ? ?Patient Active Problem List  ? Diagnosis Date Noted  ? Acute otitis media 10/14/2021  ? BMI 45.0-49.9, adult ([facility]) 08/21/2020  ? Hypertension 07/15/2020  ? Healthcare maintenance 07/12/2020  ? Necrotizing fasciitis of left buttock ([place]) 07/04/2020  ? Diabetes ([place]) 07/04/2020  ? ?  ? ?Review of Systems  ?Constitutional:  Negative for chills and fever.  ?Gastrointestinal:  Negative for abdominal pain, diarrhea, nausea and vomiting.  ?Genitourinary:  Negative for frequency.  ?Endo/Heme/Allergies:  Negative for polydipsia.  ?All other systems reviewed and are negative. ? ?  ?Objective:  ?  ? ?BP (!) 145/88 (BP Location: Right Arm, Patient Position: Sitting, Cuff Size: Large)   Pulse 94   Temp 98 ?F (36.7 ?C) (Oral)   Wt (!) 347 lb 9.6 oz (157.7 kg)   SpO2 98%   BMI 54.44 kg/m?  ?BP Readings from Last 3 Encounters:  ?04/01/22 (!) 145/88  ?03/04/22 (!) 140/91  ?10/14/21 (!) 143/86  ? ?  ? ?Physical Exam ?Constitutional:   ?   General: He is not in acute distress. ?   Appearance: He is obese.  ?HENT:  ?   Head: Normocephalic and atraumatic.  ?   Mouth/Throat:  ?   Mouth: Mucous membranes are moist.  ?   Pharynx: Oropharynx is clear.  ?Eyes:  ?   Extraocular Movements: Extraocular movements intact.  ?   Pupils: Pupils are equal, round, and reactive to light.  ?Cardiovascular:  ?   Rate and Rhythm: Normal rate and regular rhythm.  ?   Pulses: Normal pulses.  ?   Comments: DP and PT pulses 2+ and symmetric. ?Pulmonary:  ?   Effort: Pulmonary effort is normal.   ?   Breath sounds: Normal breath sounds.  ?Abdominal:  ?   General: Abdomen is flat. Bowel sounds are normal.  ?   Palpations: Abdomen is soft.  ?   Tenderness: There is no abdominal tenderness.  ?Musculoskeletal:     ?   General: Normal range of motion.  ?Skin: ?   General: Skin is warm and dry.  ?   Findings: No lesion or rash.  ?Neurological:  ?   General: No focal deficit present.  ?   Mental Status: He is alert and oriented to person, place, and time.  ?Psychiatric:     ?   Mood and Affect: Mood normal.     ?   Behavior: Behavior normal.  ? ? ? ?Results for orders placed or performed in visit on 04/01/22  ?BMP8+Anion Gap  ?Result Value Ref Range  ? Glucose 193 (H) 70 - 99 mg/dL  ? BUN 12 6 - 20 mg/dL  ? Creatinine, Ser 0.75 (L) 0.76 - 1.27 mg/dL  ? eGFR 128 >59 mL/min/1.73  ? BUN/Creatinine Ratio 16 9 - 20  ? Sodium 138 134 - 144 mmol/L  ? Potassium 5.1 3.5 - 5.2 mmol/L  ? Chloride 103 96 - 106 mmol/L  ? CO2 20 20 - 29 mmol/L  ? Anion  Gap 15.0 10.0 - 18.0 mmol/L  ? Calcium 9.2 8.7 - 10.2 mg/dL  ? ? ?Last metabolic panel ?Lab Results  ?Component Value Date  ? GLUCOSE 193 (H) 04/01/2022  ? NA 138 04/01/2022  ? K 5.1 04/01/2022  ? CL 103 04/01/2022  ? CO2 20 04/01/2022  ? BUN 12 04/01/2022  ? CREATININE 0.75 (L) 04/01/2022  ? EGFR 128 04/01/2022  ? CALCIUM 9.2 04/01/2022  ? PROT 7.9 07/03/2020  ? ALBUMIN 3.1 (L) 07/03/2020  ? BILITOT 0.7 07/03/2020  ? ALKPHOS 59 07/03/2020  ? AST 15 07/03/2020  ? ALT 26 07/03/2020  ? ANIONGAP 10 07/09/2020  ? ?  ? ?The ASCVD Risk score (Arnett DK, et al., 2019) failed to calculate for the following reasons: ?  The 2019 ASCVD risk score is only valid for ages 53 to 24 ? ?  ?Assessment & Plan:  ? ?Problem List Items Addressed This Visit   ? ?  ? Cardiovascular and Mediastinum  ? Hypertension  ?  Blood pressure 145/88 today.  He did not take his lisinopril 5 mg daily.  Only takes medications with his first meal.  Yesterday and today he did not eat tome later in the afternoon and  forgot to take his medications.  Previously placed on lisinopril for moderate microalbuminuria.  We will raise lisinopril to 10 mg daily for better blood control.  BMP today with stable kidney function. Discussed importance of regularly taking medication, he will try setting an alarm to remind himself to take his medication in case he does not eat in the mornings. ? ?  ?  ? Relevant Medications  ? lisinopril (ZESTRIL) 10 MG tablet  ?  ? Endocrine  ? Diabetes ([place])  ?  Fasting blood sugars at last visit between 170 and 210.  Reports he is mostly been taking his metformin, Lantus 40 units daily and Victoza 0.6 mg daily.  Is tolerating Victoza without GI side effects.  Will increase Victoza to 1.2 mg daily.  Encouraged him to continue to check blood sugars.  We will check urine microalbumin today.  ? ?  ?  ? Relevant Medications  ? liraglutide (VICTOZA) 18 MG/3ML SOPN  ? lisinopril (ZESTRIL) 10 MG tablet  ? Other Relevant Orders  ? BMP8+Anion Gap (Completed)  ? Microalbumin / Creatinine Urine Ratio  ?  ? Other  ? BMI 45.0-49.9, adult ([place])  ?  Has noticed some slight weight decreased with starting Victoza weight today is down approximately 1 pound from last office visit 1 month ago.  Plan to increase Victoza today.  Also discussed diet and exercise changes.  He reports he has been trying to calorie count more.  He does report fairly sedentary lifestyle.  He will think about ways he can be more. ? ?  ?  ? Relevant Medications  ? liraglutide (VICTOZA) 18 MG/3ML SOPN  ? ?Other Visit Diagnoses   ? ? Essential hypertension      ? Relevant Medications  ? lisinopril (ZESTRIL) 10 MG tablet  ? ?  ? ? ?No follow-ups on file.  ? ? ?Iona Beard, MD ? ?Discussed with Dr. Evette Doffing. ? ?

## 2022-04-02 NOTE — Assessment & Plan Note (Addendum)
Urine microalbumin checked shows worsening microalbuminuria.  His ARB was increased from better blood pressure control.  He is not a candidate for SGLT2 due to history of necrotizing fasciitis the perineum. ?

## 2022-04-02 NOTE — Assessment & Plan Note (Signed)
Has noticed some slight weight decreased with starting Victoza weight today is down approximately 1 pound from last office visit 1 month ago.  Plan to increase Victoza today.  Also discussed diet and exercise changes.  He reports he has been trying to calorie count more.  He does report fairly sedentary lifestyle.  He will think about ways he can be more. ?

## 2022-04-02 NOTE — Assessment & Plan Note (Addendum)
Fasting blood sugars at last visit between 170 and 210.  Reports he is mostly been taking his metformin, Lantus 40 units daily and Victoza 0.6 mg daily.  Is tolerating Victoza without GI side effects.  Will increase Victoza to 1.2 mg daily.  Encouraged him to continue to check blood sugars.  We will check urine microalbumin today.  ? ?Increase Victoza 1.2 mg daily ?Continue metformin and Lantus, may need to adjust Lantus dose if fasting blood sugars remain elevated ?Urine microalbumin today ?Foot exam performed ?

## 2022-04-02 NOTE — Assessment & Plan Note (Addendum)
Blood pressure 145/88 today.  He did not take his lisinopril 5 mg daily.  Only takes medications with his first meal.  Yesterday and today he did not eat tome later in the afternoon and forgot to take his medications.  Previously placed on lisinopril for moderate microalbuminuria.  We will raise lisinopril to 10 mg daily for better blood control.  BMP today with stable kidney function. Discussed importance of regularly taking medication, he will try setting an alarm to remind himself to take his medication in case he does not eat in the mornings.  Follow-up in 2 weeks on blood pressure. ?

## 2022-04-02 NOTE — Assessment & Plan Note (Signed)
>>  ASSESSMENT AND PLAN FOR BMI 45.0-49.9, ADULT ([facility]) WRITTEN ON 04/02/2022  8:39 AM BY LIANG, JESSICA, MD  Has noticed some slight weight decreased with starting Victoza weight today is down approximately 1 pound from last office visit 1 month ago.  Plan to increase Victoza today.  Also discussed diet and exercise changes.  He reports he has been trying to calorie count more.  He does report fairly sedentary lifestyle.  He will think about ways he can be more.

## 2022-04-02 NOTE — Progress Notes (Signed)
Internal Medicine Clinic Attending ? ?Case discussed with Dr. Liang  At the time of the visit.  We reviewed the resident?s history and exam and pertinent patient test results.  I agree with the assessment, diagnosis, and plan of care documented in the resident?s note. ? ?

## 2022-04-06 ENCOUNTER — Other Ambulatory Visit (HOSPITAL_COMMUNITY): Payer: Self-pay

## 2022-04-06 ENCOUNTER — Other Ambulatory Visit: Payer: Self-pay | Admitting: Internal Medicine

## 2022-04-06 DIAGNOSIS — E119 Type 2 diabetes mellitus without complications: Secondary | ICD-10-CM

## 2022-04-06 NOTE — Telephone Encounter (Signed)
Next appt scheduled 5/24 with Dr Elaina Pattee. ?

## 2022-04-07 MED ORDER — LANTUS SOLOSTAR 100 UNIT/ML ~~LOC~~ SOPN
40.0000 [IU] | PEN_INJECTOR | Freq: Every day | SUBCUTANEOUS | 0 refills | Status: DC
  Filled 2022-04-07: qty 12, 30d supply, fill #0

## 2022-04-08 ENCOUNTER — Other Ambulatory Visit (HOSPITAL_COMMUNITY): Payer: Self-pay

## 2022-04-15 ENCOUNTER — Encounter: Payer: Medicaid Other | Admitting: Student

## 2022-04-16 ENCOUNTER — Other Ambulatory Visit (HOSPITAL_COMMUNITY): Payer: Self-pay

## 2022-04-29 ENCOUNTER — Encounter: Payer: Medicaid Other | Admitting: Internal Medicine

## 2022-04-29 ENCOUNTER — Telehealth: Payer: Self-pay | Admitting: *Deleted

## 2022-04-29 ENCOUNTER — Encounter: Payer: Self-pay | Admitting: Internal Medicine

## 2022-04-29 NOTE — Telephone Encounter (Signed)
Call to patient regarding missed appointment for this morning.  Message left that Clinics had call ant ot call to reschedule appointment.

## 2022-05-08 ENCOUNTER — Other Ambulatory Visit (HOSPITAL_COMMUNITY): Payer: Self-pay

## 2022-05-08 ENCOUNTER — Other Ambulatory Visit: Payer: Self-pay | Admitting: Internal Medicine

## 2022-05-08 DIAGNOSIS — E119 Type 2 diabetes mellitus without complications: Secondary | ICD-10-CM

## 2022-05-09 MED ORDER — LANTUS SOLOSTAR 100 UNIT/ML ~~LOC~~ SOPN
40.0000 [IU] | PEN_INJECTOR | Freq: Every day | SUBCUTANEOUS | 0 refills | Status: DC
  Filled 2022-05-09: qty 12, 30d supply, fill #0

## 2022-05-11 ENCOUNTER — Other Ambulatory Visit (HOSPITAL_COMMUNITY): Payer: Self-pay

## 2022-05-13 ENCOUNTER — Other Ambulatory Visit (HOSPITAL_COMMUNITY): Payer: Self-pay

## 2022-06-02 ENCOUNTER — Other Ambulatory Visit: Payer: Self-pay | Admitting: Internal Medicine

## 2022-06-02 ENCOUNTER — Other Ambulatory Visit (HOSPITAL_COMMUNITY): Payer: Self-pay

## 2022-06-02 ENCOUNTER — Other Ambulatory Visit: Payer: Self-pay

## 2022-06-02 ENCOUNTER — Encounter: Payer: Self-pay | Admitting: Student

## 2022-06-02 ENCOUNTER — Ambulatory Visit (INDEPENDENT_AMBULATORY_CARE_PROVIDER_SITE_OTHER): Payer: Self-pay | Admitting: Student

## 2022-06-02 DIAGNOSIS — E119 Type 2 diabetes mellitus without complications: Secondary | ICD-10-CM

## 2022-06-02 DIAGNOSIS — R21 Rash and other nonspecific skin eruption: Secondary | ICD-10-CM

## 2022-06-02 DIAGNOSIS — Z794 Long term (current) use of insulin: Secondary | ICD-10-CM

## 2022-06-02 HISTORY — DX: Rash and other nonspecific skin eruption: R21

## 2022-06-02 NOTE — Assessment & Plan Note (Signed)
Patient has a past medical history of diabetes mellitus.  He understands that his diagnosis of diabetes contributes to poor wound healing.  He notes that he has a history of necrotizing fasciitis as well.  This is what initially brought him in to get this evaluated.  I recommend him to continue to watch his blood sugars.  He states that they have been in the 180s to 200s.  He notes that he is doing better with his diet and exercise.  Patient's last A1c on 04/12 was 9.5.  Plan: -Plan is to continue to have patient monitor his blood sugar levels - Patient has a follow-up appointment for his diabetes on 07/30. - Plan to get A1c and foot exam at that time
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
## 2022-06-02 NOTE — Patient Instructions (Signed)
[name], Scott Logan you for allowing me to take part in your care today.  Here are your instructions.  1.  Please continue to keep an eye on your wound.  Make sure that it does not get any more red, painful, or you do not develop any more chills.  Wound seems to be healing nicely.  No need to put a Band-Aid on it anymore.  We recommended you not do any more Neosporin or alcohol on it to avoid more irritation.  2.  Please call us if you notice any fevers, chills, or night sweats.  Please follow-up with our clinic for your diabetes.   Thank you, Dr. Allena Katz  If you have any other questions please contact the internal medicine clinic at [phone]

## 2022-06-03 ENCOUNTER — Other Ambulatory Visit (HOSPITAL_COMMUNITY): Payer: Self-pay

## 2022-06-03 ENCOUNTER — Other Ambulatory Visit: Payer: Self-pay | Admitting: Internal Medicine

## 2022-06-03 DIAGNOSIS — E119 Type 2 diabetes mellitus without complications: Secondary | ICD-10-CM

## 2022-06-03 NOTE — Progress Notes (Signed)
Internal Medicine Clinic Attending  I saw and evaluated the patient.  I personally confirmed the key portions of the history and exam documented by Dr. Patel and I reviewed pertinent patient test results.  The assessment, diagnosis, and plan were formulated together and I agree with the documentation in the resident's note.  

## 2022-06-04 ENCOUNTER — Other Ambulatory Visit (HOSPITAL_COMMUNITY): Payer: Self-pay

## 2022-06-04 MED ORDER — LANTUS SOLOSTAR 100 UNIT/ML ~~LOC~~ SOPN
40.0000 [IU] | PEN_INJECTOR | Freq: Every day | SUBCUTANEOUS | 0 refills | Status: DC
  Filled 2022-06-04: qty 12, 30d supply, fill #0

## 2022-06-08 ENCOUNTER — Other Ambulatory Visit (HOSPITAL_COMMUNITY): Payer: Self-pay

## 2022-06-09 ENCOUNTER — Other Ambulatory Visit (HOSPITAL_COMMUNITY): Payer: Self-pay

## 2022-06-19 ENCOUNTER — Other Ambulatory Visit (HOSPITAL_COMMUNITY): Payer: Self-pay

## 2022-06-22 ENCOUNTER — Ambulatory Visit (INDEPENDENT_AMBULATORY_CARE_PROVIDER_SITE_OTHER): Payer: Self-pay | Admitting: Internal Medicine

## 2022-06-22 ENCOUNTER — Other Ambulatory Visit: Payer: Self-pay

## 2022-06-22 ENCOUNTER — Encounter: Payer: Self-pay | Admitting: Internal Medicine

## 2022-06-22 ENCOUNTER — Other Ambulatory Visit (HOSPITAL_COMMUNITY): Payer: Self-pay

## 2022-06-22 VITALS — BP 145/90 | HR 92 | Temp 98.1°F | Ht 67.0 in | Wt 351.7 lb

## 2022-06-22 DIAGNOSIS — Z0000 Encounter for general adult medical examination without abnormal findings: Secondary | ICD-10-CM

## 2022-06-22 DIAGNOSIS — E119 Type 2 diabetes mellitus without complications: Secondary | ICD-10-CM

## 2022-06-22 DIAGNOSIS — Z7984 Long term (current) use of oral hypoglycemic drugs: Secondary | ICD-10-CM

## 2022-06-22 DIAGNOSIS — Z0120 Encounter for dental examination and cleaning without abnormal findings: Secondary | ICD-10-CM

## 2022-06-22 DIAGNOSIS — Z794 Long term (current) use of insulin: Secondary | ICD-10-CM

## 2022-06-22 DIAGNOSIS — I10 Essential (primary) hypertension: Secondary | ICD-10-CM

## 2022-06-22 DIAGNOSIS — G4733 Obstructive sleep apnea (adult) (pediatric): Secondary | ICD-10-CM

## 2022-06-22 LAB — POCT GLYCOSYLATED HEMOGLOBIN (HGB A1C): Hemoglobin A1C: 7.8 % — AB (ref 4.0–5.6)

## 2022-06-22 LAB — GLUCOSE, CAPILLARY: Glucose-Capillary: 232 mg/dL — ABNORMAL HIGH (ref 70–99)

## 2022-06-22 MED ORDER — LIRAGLUTIDE 18 MG/3ML ~~LOC~~ SOPN
1.8000 mg | PEN_INJECTOR | Freq: Every day | SUBCUTANEOUS | 3 refills | Status: DC
  Filled 2022-06-22 – 2022-07-06 (×2): qty 6, 20d supply, fill #0
  Filled 2022-07-24: qty 9, 30d supply, fill #1

## 2022-06-22 NOTE — Assessment & Plan Note (Addendum)
Last A1c 9.5% and today it is improved to 7.8%. The patient has been taking Lantus 40u daily, metformin 1 g bid, and Victoza 1.2 mg daily. The patient has been feeling well and I congratulated him on his improving A1c. We discussed his A1c goal of 7.0% and will increase his Victoza today to hopefully get his A1c to goal. Additionally, the patient states that he does not check his blood sugars regularly, but he is interested in getting a Dexcom. The patient does not have any insurance (has the orange card) and unfortunately may be cost prohibitive for this patient.  We do have samples of the Dexcom, however, this is only for 10 days and would not be a good long-term solution for the patient.  Plan: - Increase Victoza to 1.8 mg daily - Continue Lantus 40u daily - Continue metformin 1 g bid - Referral for diabetes retinopathy screening - Foot exam performed today

## 2022-06-22 NOTE — Progress Notes (Signed)
CC: diabetes  HPI:  Scott Logan is a 26 y.o. male living with a history stated below and presents today for a follow up of his diabetes. Please see problem based assessment and plan for additional details.  Past Medical History:  Diagnosis Date   Asthma    Diabetes ([place])    Hypertension     Current Outpatient Medications on File Prior to Visit  Medication Sig Dispense Refill   blood glucose meter kit and supplies KIT Dispense based on patient and insurance preference. Use up to four times daily as directed. (FOR ICD-9 250.00, 250.01). 1 each 0   Continuous Blood Gluc Sensor (FREESTYLE LIBRE 2 SENSOR) MISC Use to check blood sugar at least 6 times a day 2 each 12   insulin glargine (LANTUS SOLOSTAR) 100 UNIT/ML Solostar Pen Inject 40 Units into the skin daily. 12 mL 0   Insulin Pen Needle 32G X 4 MM MISC USE AS DIRECTED TO INJECT INSULIN FOUR TIMES DAILY. 100 each 3   lisinopril (ZESTRIL) 10 MG tablet Take 1 tablet (10 mg total) by mouth daily. 30 tablet 2   metFORMIN (GLUCOPHAGE) 1000 MG tablet Take 1 tablet (1,000 mg total) by mouth 2 (two) times daily with a meal. 60 tablet 5   nystatin (MYCOSTATIN/NYSTOP) powder Apply topically 3 (three) times daily. 60 g 0   No current facility-administered medications on file prior to visit.    Family History  Problem Relation Age of Onset   Diabetes Sister     Social History   Socioeconomic History   Marital status: Single    Spouse name: Not on file   Number of children: Not on file   Years of education: Not on file   Highest education level: Not on file  Occupational History   Occupation: Photographer  Tobacco Use   Smoking status: Never   Smokeless tobacco: Never  Vaping Use   Vaping Use: Never used  Substance and Sexual Activity   Alcohol use: Never   Drug use: Never   Sexual activity: Yes    Birth control/protection: Condom, Coitus interruptus  Other Topics Concern   Not on file  Social History Narrative    Not on file   Social Determinants of Health   Financial Resource Strain: Not on file  Food Insecurity: Not on file  Transportation Needs: Not on file  Physical Activity: Not on file  Stress: Not on file  Social Connections: Not on file  Intimate Partner Violence: Not on file    Review of Systems: ROS negative except for what is noted on the assessment and plan.  Vitals:   06/22/22 1057 06/22/22 1122  BP: 134/86 (!) 145/90  Pulse: 95 92  Temp: 98.1 F (36.7 C)   TempSrc: Oral   SpO2: 98% 96%  Weight: (!) 351 lb 11.2 oz (159.5 kg)   Height: _0  (1.702 m)     Physical Exam: Constitutional: no acute distress Cardiovascular: regular rate and rhythm, no m/r/g Pulmonary/Chest: normal work of breathing on room air, lungs clear to auscultation bilaterally Abdominal: soft, non-tender, obese abdomen  MSK: normal bulk and tone Neurological: alert & oriented x 3, no focal deficit Skin: warm and dry Psych: normal mood and behavior  Assessment & Plan:     Patient discussed with Dr. Angelia Mould  Diabetes [facility]) Last A1c 9.5% and today it is improved to 7.8%. The patient has been taking Lantus 40u daily, metformin 1 g bid, and Victoza 1.2 mg daily.  The patient has been feeling well and I congratulated him on his improving A1c. We discussed his A1c goal of 7.0% and will increase his Victoza today to hopefully get his A1c to goal. Additionally, the patient states that he does not check his blood sugars regularly, but he is interested in getting a Dexcom. The patient does not have any insurance (has the orange card) and unfortunately may be cost prohibitive for this patient.  We do have samples of the Dexcom, however, this is only for 10 days and would not be a good long-term solution for the patient.  Plan: - Increase Victoza to 1.8 mg daily - Continue Lantus 40u daily - Continue metformin 1 g bid - Referral for diabetes retinopathy screening - Foot exam performed  today  Hypertension BP Readings from Last 3 Encounters:  06/22/22 (!) 145/90  06/02/22 138/83  04/01/22 (!) 145/88   BP elevated today to 145/90. The patient has been taking lisinopril 10 mg daily and has been doing well with this medication. While his blood pressure was being checked, the patient got an alarm that someone was entering his home which frightened him (the patient realized it was just maintenance) and this could have raised his BP. We discussed the importance of good blood pressure control, especially with his diagnosis of diabetes.  Plan: - Continue lisinopril 10 mg daily   OSA (obstructive sleep apnea) The patient has not had a formal sleep study done, as he does not have insurance, however, he did recently just get the orange card. His STOP-BANG score is very elevated (positive for snoring, fatigue, observed apnea, HTN, BMI, and gender) and I suspect that the patient does have some degree of sleep apnea.   Plan: - Referral for sleep study placed today  Healthcare maintenance Patient has now obtained the orange card - referral placed for diabetic eye exam and dentist today  Matheson Vandehei, D.O. [facility], PGY-2 Phone: [phone] Date 06/22/2022 Time 11:48 AM

## 2022-06-22 NOTE — Assessment & Plan Note (Signed)
BP Readings from Last 3 Encounters:  06/22/22 (!) 145/90  06/02/22 138/83  04/01/22 (!) 145/88   BP elevated today to 145/90. The patient has been taking lisinopril 10 mg daily and has been doing well with this medication. While his blood pressure was being checked, the patient got an alarm that someone was entering his home which frightened him (the patient realized it was just maintenance) and this could have raised his BP. We discussed the importance of good blood pressure control, especially with his diagnosis of diabetes.  Plan: - Continue lisinopril 10 mg daily

## 2022-06-22 NOTE — Assessment & Plan Note (Signed)
Patient has now obtained the orange card - referral placed for diabetic eye exam and dentist today

## 2022-06-22 NOTE — Patient Instructions (Signed)
Thank you, [name] for allowing [place] to provide your care today. Today we discussed:  Diabetes: Your A1c is getting better and is almost at our goal of 7! It is 7.8 today, so keep up the great work. Keep using 40 units of lantus each night and keep taking metformin twice a day. We are going to increase your Victoza dose to 1.8 mg daily. I have placed a referral for you to get an eye exam   I have placed a referral for you to get a sleep study and to see a dentist  I have ordered the following labs for you:  Lab Orders         Glucose, capillary         POC Hbg A1C      Tests ordered today:  Sleep study  Referrals ordered today:   Referral Orders         Ambulatory referral to Dentistry      I have ordered the following medication/changed the following medications:   Stop the following medications: Medications Discontinued During This Encounter  Medication Reason   liraglutide (VICTOZA) 18 MG/3ML SOPN Reorder     Start the following medications: Meds ordered this encounter  Medications   liraglutide (VICTOZA) 18 MG/3ML SOPN    Sig: Inject 1.8 mg into the skin daily.    Dispense:  6 mL    Refill:  3    IM program     Follow up: 3 months for diabetes   Remember: To increase your victoza dose  Should you have any questions or concerns please call the internal medicine clinic at [phone].     Elza Rafter, D.O. [facility]

## 2022-06-22 NOTE — Assessment & Plan Note (Signed)
The patient has not had a formal sleep study done, as he does not have insurance, however, he did recently just get the orange card. His STOP-BANG score is very elevated (positive for snoring, fatigue, observed apnea, HTN, BMI, and gender) and I suspect that the patient does have some degree of sleep apnea.   Plan: - Referral for sleep study placed today

## 2022-06-25 NOTE — Progress Notes (Signed)
Internal Medicine Clinic Attending  Case discussed with the resident at the time of the visit.  We reviewed the resident's history and exam and pertinent patient test results.  I agree with the assessment, diagnosis, and plan of care documented in the resident's note.  

## 2022-06-29 ENCOUNTER — Encounter: Payer: Self-pay | Admitting: Internal Medicine

## 2022-06-29 NOTE — Addendum Note (Signed)
Addended by: Elza Rafter on: 06/29/2022 02:42 PM   Modules accepted: Orders

## 2022-07-06 ENCOUNTER — Other Ambulatory Visit (HOSPITAL_COMMUNITY): Payer: Self-pay

## 2022-07-06 ENCOUNTER — Other Ambulatory Visit: Payer: Self-pay | Admitting: Internal Medicine

## 2022-07-06 ENCOUNTER — Other Ambulatory Visit: Payer: Self-pay | Admitting: Student

## 2022-07-06 DIAGNOSIS — E119 Type 2 diabetes mellitus without complications: Secondary | ICD-10-CM

## 2022-07-06 DIAGNOSIS — I10 Essential (primary) hypertension: Secondary | ICD-10-CM

## 2022-07-06 MED ORDER — LISINOPRIL 10 MG PO TABS
10.0000 mg | ORAL_TABLET | Freq: Every day | ORAL | 3 refills | Status: DC
  Filled 2022-07-06: qty 30, 30d supply, fill #0
  Filled 2022-08-18: qty 30, 30d supply, fill #1
  Filled 2022-09-17: qty 30, 30d supply, fill #2
  Filled 2022-10-28: qty 30, 30d supply, fill #3

## 2022-07-06 MED ORDER — LANTUS SOLOSTAR 100 UNIT/ML ~~LOC~~ SOPN
40.0000 [IU] | PEN_INJECTOR | Freq: Every day | SUBCUTANEOUS | 1 refills | Status: DC
  Filled 2022-07-06: qty 12, 30d supply, fill #0
  Filled 2022-08-06: qty 12, 30d supply, fill #1

## 2022-07-24 ENCOUNTER — Other Ambulatory Visit (HOSPITAL_COMMUNITY): Payer: Self-pay

## 2022-07-24 MED ORDER — LIRAGLUTIDE 18 MG/3ML ~~LOC~~ SOPN
1.8000 mg | PEN_INJECTOR | Freq: Every day | SUBCUTANEOUS | 3 refills | Status: DC
  Filled 2022-07-24: qty 9, 30d supply, fill #0
  Filled 2022-08-18: qty 9, 30d supply, fill #1
  Filled 2022-10-05: qty 9, 30d supply, fill #2
  Filled 2022-10-30: qty 9, 30d supply, fill #3

## 2022-07-24 NOTE — Addendum Note (Signed)
Addended by: Elza Rafter on: 07/24/2022 11:27 AM   Modules accepted: Orders

## 2022-08-06 ENCOUNTER — Other Ambulatory Visit (HOSPITAL_COMMUNITY): Payer: Self-pay

## 2022-08-10 ENCOUNTER — Other Ambulatory Visit: Payer: Self-pay | Admitting: Internal Medicine

## 2022-08-18 ENCOUNTER — Other Ambulatory Visit (HOSPITAL_COMMUNITY): Payer: Self-pay

## 2022-08-21 ENCOUNTER — Other Ambulatory Visit (HOSPITAL_COMMUNITY): Payer: Self-pay

## 2022-09-01 ENCOUNTER — Telehealth: Payer: Self-pay | Admitting: Dietician

## 2022-09-08 NOTE — Telephone Encounter (Signed)
Left voicemail for return call and to make an appointment with me for retinal camera next time he has an appointment in our office.

## 2022-09-09 ENCOUNTER — Other Ambulatory Visit: Payer: Self-pay | Admitting: Internal Medicine

## 2022-09-09 ENCOUNTER — Other Ambulatory Visit (HOSPITAL_COMMUNITY): Payer: Self-pay

## 2022-09-09 DIAGNOSIS — E119 Type 2 diabetes mellitus without complications: Secondary | ICD-10-CM

## 2022-09-09 MED ORDER — LANTUS SOLOSTAR 100 UNIT/ML ~~LOC~~ SOPN
40.0000 [IU] | PEN_INJECTOR | Freq: Every day | SUBCUTANEOUS | 1 refills | Status: DC
  Filled 2022-09-09: qty 12, 30d supply, fill #0
  Filled 2022-10-05: qty 12, 30d supply, fill #1

## 2022-09-17 ENCOUNTER — Other Ambulatory Visit (HOSPITAL_COMMUNITY): Payer: Self-pay

## 2022-09-21 ENCOUNTER — Other Ambulatory Visit (HOSPITAL_COMMUNITY): Payer: Self-pay

## 2022-09-21 ENCOUNTER — Other Ambulatory Visit: Payer: Self-pay | Admitting: Internal Medicine

## 2022-09-21 DIAGNOSIS — E119 Type 2 diabetes mellitus without complications: Secondary | ICD-10-CM

## 2022-09-21 MED ORDER — METFORMIN HCL 1000 MG PO TABS
1000.0000 mg | ORAL_TABLET | Freq: Two times a day (BID) | ORAL | 5 refills | Status: DC
  Filled 2022-09-21: qty 60, 30d supply, fill #0
  Filled 2022-10-28: qty 60, 30d supply, fill #1
  Filled 2022-12-02: qty 60, 30d supply, fill #2

## 2022-10-05 ENCOUNTER — Encounter: Payer: Self-pay | Admitting: Dietician

## 2022-10-05 ENCOUNTER — Other Ambulatory Visit (HOSPITAL_COMMUNITY): Payer: Self-pay

## 2022-10-13 ENCOUNTER — Encounter: Payer: Self-pay | Admitting: Internal Medicine

## 2022-10-20 ENCOUNTER — Other Ambulatory Visit (HOSPITAL_COMMUNITY): Payer: Self-pay

## 2022-10-28 ENCOUNTER — Other Ambulatory Visit (HOSPITAL_COMMUNITY): Payer: Self-pay

## 2022-10-30 ENCOUNTER — Other Ambulatory Visit: Payer: Self-pay

## 2022-10-30 ENCOUNTER — Other Ambulatory Visit (HOSPITAL_COMMUNITY): Payer: Self-pay

## 2022-10-30 DIAGNOSIS — E119 Type 2 diabetes mellitus without complications: Secondary | ICD-10-CM

## 2022-11-02 ENCOUNTER — Other Ambulatory Visit: Payer: Self-pay

## 2022-11-03 ENCOUNTER — Other Ambulatory Visit (HOSPITAL_COMMUNITY): Payer: Self-pay

## 2022-11-03 MED ORDER — LANTUS SOLOSTAR 100 UNIT/ML ~~LOC~~ SOPN
40.0000 [IU] | PEN_INJECTOR | Freq: Every day | SUBCUTANEOUS | 0 refills | Status: DC
  Filled 2022-11-03 – 2022-11-12 (×2): qty 3, 7d supply, fill #0

## 2022-11-05 ENCOUNTER — Encounter: Payer: Self-pay | Admitting: Internal Medicine

## 2022-11-05 NOTE — Progress Notes (Deleted)
CC: diabetes  HPI:  Scott Logan is a 26 y.o. male living with a history stated below and presents today for ***. Please see problem based assessment and plan for additional details.  Past Medical History:  Diagnosis Date   Acute otitis media 10/14/2021   Asthma    Diabetes ([place])    Hypertension    Necrotizing fasciitis of left buttock ([place]) 07/04/2020   Admitted on 8/12 for necrotizing fasciitis of perineum extending into base of scrotum, underwent debridement on 8/12, taken back to OR for possible debridement but no necrotic areas wound and just washed out. Wound culture grew MSSA, discharged on 8/17 with PO Keflex for 10 days total, also nystatin for yeast noted around wound.       Current Outpatient Medications on File Prior to Visit  Medication Sig Dispense Refill   blood glucose meter kit and supplies KIT Dispense based on patient and insurance preference. Use up to four times daily as directed. (FOR ICD-9 250.00, 250.01). 1 each 0   Continuous Blood Gluc Sensor (FREESTYLE LIBRE 2 SENSOR) MISC Use to check blood sugar at least 6 times a day 2 each 12   insulin glargine (LANTUS SOLOSTAR) 100 UNIT/ML Solostar Pen Inject 40 Units into the skin daily. 3 mL 0   Insulin Pen Needle 32G X 4 MM MISC USE AS DIRECTED TO INJECT INSULIN FOUR TIMES DAILY. 100 each 3   liraglutide (VICTOZA) 18 MG/3ML SOPN Inject 1.8 mg into the skin daily. 9 mL 3   lisinopril (ZESTRIL) 10 MG tablet Take 1 tablet (10 mg total) by mouth daily. 90 tablet 3   metFORMIN (GLUCOPHAGE) 1000 MG tablet Take 1 tablet (1,000 mg total) by mouth 2 (two) times daily with a meal. 60 tablet 5   No current facility-administered medications on file prior to visit.    Family History  Problem Relation Age of Onset   Diabetes Sister     Social History   Socioeconomic History   Marital status: Single    Spouse name: Not on file   Number of children: Not on file   Years of education: Not on file   Highest  education level: Not on file  Occupational History   Occupation: Photographer  Tobacco Use   Smoking status: Never   Smokeless tobacco: Never  Vaping Use   Vaping Use: Never used  Substance and Sexual Activity   Alcohol use: Never   Drug use: Never   Sexual activity: Yes    Birth control/protection: Condom, Coitus interruptus  Other Topics Concern   Not on file  Social History Narrative   Not on file   Social Determinants of Health   Financial Resource Strain: Not on file  Food Insecurity: Not on file  Transportation Needs: Not on file  Physical Activity: Not on file  Stress: Not on file  Social Connections: Not on file  Intimate Partner Violence: Not on file    Review of Systems: ROS negative except for what is noted on the assessment and plan.  There were no vitals filed for this visit.  Physical Exam: Constitutional: well-appearing *** sitting in ***, in no acute distress HENT: normocephalic atraumatic, mucous membranes moist Eyes: conjunctiva non-erythematous Cardiovascular: regular rate and rhythm, no m/r/g Pulmonary/Chest: normal work of breathing on room air, lungs clear to auscultation bilaterally Abdominal: soft, non-tender, non-distended MSK: normal bulk and tone Neurological: alert & oriented x 3, no focal deficit Skin: warm and dry Psych: normal mood and behavior  Assessment & Plan:   DM: A1c 7.8 >  ( was 13.1 two years ago) - lantus 40 - victoza 1.8 - metforin 1 g bid  HTN: lisinopril 10  HCM: flu?  Patient {GC/GE:[id number]::"discussed with","seen with"} Dr. {[id number]::"VWAQLRJP","V. Hoffman","Mullen","Narendra","Vincent","Guilloud","Lau","Machen"}  No problem-specific Assessment & Plan notes found for this encounter.   Buddy Duty, D.O. [facility], PGY-2 Phone: [phone] Date 11/05/2022 Time 7:33 AM

## 2022-11-10 ENCOUNTER — Other Ambulatory Visit (HOSPITAL_COMMUNITY): Payer: Self-pay

## 2022-11-12 ENCOUNTER — Ambulatory Visit (INDEPENDENT_AMBULATORY_CARE_PROVIDER_SITE_OTHER): Payer: Self-pay

## 2022-11-12 ENCOUNTER — Telehealth: Payer: Self-pay | Admitting: Internal Medicine

## 2022-11-12 ENCOUNTER — Other Ambulatory Visit (HOSPITAL_COMMUNITY): Payer: Self-pay

## 2022-11-12 ENCOUNTER — Other Ambulatory Visit: Payer: Self-pay | Admitting: Internal Medicine

## 2022-11-12 VITALS — BP 152/92 | HR 107 | Temp 98.0°F | Ht 67.0 in | Wt 354.2 lb

## 2022-11-12 DIAGNOSIS — G4733 Obstructive sleep apnea (adult) (pediatric): Secondary | ICD-10-CM

## 2022-11-12 DIAGNOSIS — I10 Essential (primary) hypertension: Secondary | ICD-10-CM

## 2022-11-12 DIAGNOSIS — E119 Type 2 diabetes mellitus without complications: Secondary | ICD-10-CM

## 2022-11-12 DIAGNOSIS — Z794 Long term (current) use of insulin: Secondary | ICD-10-CM

## 2022-11-12 DIAGNOSIS — Z7984 Long term (current) use of oral hypoglycemic drugs: Secondary | ICD-10-CM

## 2022-11-12 LAB — POCT GLYCOSYLATED HEMOGLOBIN (HGB A1C): Hemoglobin A1C: 8.8 % — AB (ref 4.0–5.6)

## 2022-11-12 LAB — GLUCOSE, CAPILLARY: Glucose-Capillary: 212 mg/dL — ABNORMAL HIGH (ref 70–99)

## 2022-11-12 MED ORDER — LANTUS SOLOSTAR 100 UNIT/ML ~~LOC~~ SOPN
40.0000 [IU] | PEN_INJECTOR | Freq: Every day | SUBCUTANEOUS | 3 refills | Status: DC
  Filled 2022-11-12: qty 3, 7d supply, fill #0

## 2022-11-12 MED ORDER — LISINOPRIL 20 MG PO TABS
20.0000 mg | ORAL_TABLET | Freq: Every day | ORAL | 11 refills | Status: DC
  Filled 2022-11-12: qty 30, 30d supply, fill #0

## 2022-11-12 MED ORDER — LANTUS SOLOSTAR 100 UNIT/ML ~~LOC~~ SOPN
40.0000 [IU] | PEN_INJECTOR | Freq: Every day | SUBCUTANEOUS | 3 refills | Status: DC
  Filled 2022-11-12: qty 12, 30d supply, fill #0

## 2022-11-12 NOTE — Assessment & Plan Note (Signed)
BP Readings from Last 3 Encounters:  11/12/22 (!) 152/92  06/22/22 (!) 145/90  06/02/22 138/83   Patient presents with elevated blood pressure despite lisinopril 10 mg daily. Denies symptoms of hypertension such as headaches, vision changes, chest pain. Denies shortness of breath or swelling. Denies hypotensive symptoms such as lightheadedness, dizziness.  Plan: Increase lisinopril to 20 mg daily with BP check in 2 weeks.

## 2022-11-12 NOTE — Patient Instructions (Signed)
Scott Logan, it was a pleasure seeing you today!  Today we discussed: Diabetes- work on diet and exercise. Scheduled appointment with Lupita Leash.  High blood pressure- Take lisinopril 20 mg daily. Return in 2-4 weeks for blood pressure check.  I have ordered the following labs today:  Lab Orders         Glucose, capillary         POC Hbg A1C      Tests ordered today:  A1c  Referrals ordered today:   Referral Orders  No referral(s) requested today     I have ordered the following medication/changed the following medications:   Stop the following medications: Medications Discontinued During This Encounter  Medication Reason   lisinopril (ZESTRIL) 10 MG tablet Dose change   insulin glargine (LANTUS SOLOSTAR) 100 UNIT/ML Solostar Pen Reorder     Start the following medications: Meds ordered this encounter  Medications   lisinopril (ZESTRIL) 20 MG tablet    Sig: Take 1 tablet (20 mg total) by mouth daily.    Dispense:  30 tablet    Refill:  11   insulin glargine (LANTUS SOLOSTAR) 100 UNIT/ML Solostar Pen    Sig: Inject 40 Units into the skin daily.    Dispense:  3 mL    Refill:  3    IM PROGRAM     Follow-up:  2-4 weeks    Please make sure to arrive 15 minutes prior to your next appointment. If you arrive late, you may be asked to reschedule.   We look forward to seeing you next time. Please call our clinic at [phone] if you have any questions or concerns. The best time to call is Monday-Friday from 9am-4pm, but there is someone available 24/7. If after hours or the weekend, call the main hospital number and ask for the Internal Medicine Resident On-Call. If you need medication refills, please notify your pharmacy one week in advance and they will send [place] a request.  Thank you for letting us take part in your care. Wishing you the best!  Thank you, Adron Bene, MD

## 2022-11-12 NOTE — Assessment & Plan Note (Signed)
Will need referral for sleep study once obtains medicaid.

## 2022-11-12 NOTE — Progress Notes (Signed)
CC: med refill  HPI:  Scott Logan is a 26 y.o. with past medical history as below who presents for med refill. Please see detailed assessment and plan for HPI.  Past Medical History:  Diagnosis Date   Acute otitis media 10/14/2021   Asthma    Diabetes (HCC)    Hypertension    Necrotizing fasciitis of left buttock (HCC) 07/04/2020   Admitted on 8/12 for necrotizing fasciitis of perineum extending into base of scrotum, underwent debridement on 8/12, taken back to OR for possible debridement but no necrotic areas wound and just washed out. Wound culture grew MSSA, discharged on 8/17 with PO Keflex for 10 days total, also nystatin for yeast noted around wound.      Review of Systems:  See detailed assessment and plan for pertinent ROS.  Physical Exam:  Vitals:   11/12/22 1608  BP: (!) 152/92  Pulse: (!) 107  Temp: 98 F (36.7 C)  TempSrc: Oral  SpO2: 99%  Weight: (!) 354 lb 3.2 oz (160.7 kg)  Height: 5\' 7"  (1.702 m)   Physical Exam Constitutional:      General: He is not in acute distress.    Appearance: He is obese.  HENT:     Head: Normocephalic and atraumatic.  Eyes:     Extraocular Movements: Extraocular movements intact.  Cardiovascular:     Rate and Rhythm: Normal rate and regular rhythm.  Pulmonary:     Effort: Pulmonary effort is normal.     Breath sounds: No wheezing, rhonchi or rales.  Musculoskeletal:     Cervical back: Neck supple.     Right lower leg: No edema.     Left lower leg: No edema.  Skin:    General: Skin is warm and dry.  Neurological:     Mental Status: He is alert and oriented to person, place, and time.  Psychiatric:        Mood and Affect: Mood normal.        Behavior: Behavior normal.      Assessment & Plan:   See Encounters Tab for problem based charting.  Type 2 diabetes mellitus without complication, unspecified whether long term insulin use (HCC) Assessment & Plan: A1c up from 7.8 to 8.8 today. Patient reports  his diet has been worse these past few months and he has been less active lately. Victoza increased to 1.8 mg at last visit. Patient endorses good medication compliance with this as well as metformin 2 g daily and lantus 40 u nightly. Unable to produce blood sugar log but reports glucose almost always in the 200s. Not able to recall any under 100. Takes glucose sporadically so is unable to describe fasting sugars specifically. Interested in obtaining dexcom once insured. Denies hypoglycemic symptoms.  Plan: Discussed continuing current regimen and attempting diet and lifestyle changes. If refractory to changes at f/u, would benefit from mealtime insulin (SGLT-2 contraindicated given hx of perineal nec fasc). Discussed setting up appointment with diabetes educator though this may need to wait until patient has obtained medicaid.   Orders: -     POCT glycosylated hemoglobin (Hb A1C) -     Glucose, capillary -     Lantus SoloStar; Inject 40 Units into the skin daily.  Dispense: 3 mL; Refill: 3  Primary hypertension Assessment & Plan: BP Readings from Last 3 Encounters:  11/12/22 (!) 152/92  06/22/22 (!) 145/90  06/02/22 138/83   Patient presents with elevated blood pressure despite lisinopril 10 mg daily.  Denies symptoms of hypertension such as headaches, vision changes, chest pain. Denies shortness of breath or swelling. Denies hypotensive symptoms such as lightheadedness, dizziness.  Plan: Increase lisinopril to 20 mg daily with BP check in 2 weeks.  Orders: -     Lisinopril; Take 1 tablet (20 mg total) by mouth daily.  Dispense: 30 tablet; Refill: 11  OSA (obstructive sleep apnea) Assessment & Plan: Will need referral for sleep study once obtains medicaid.      Patient seen with Dr. Oswaldo Done

## 2022-11-12 NOTE — Assessment & Plan Note (Signed)
A1c up from 7.8 to 8.8 today. Patient reports his diet has been worse these past few months and he has been less active lately. Victoza increased to 1.8 mg at last visit. Patient endorses good medication compliance with this as well as metformin 2 g daily and lantus 40 u nightly. Unable to produce blood sugar log but reports glucose almost always in the 200s. Not able to recall any under 100. Takes glucose sporadically so is unable to describe fasting sugars specifically. Interested in obtaining dexcom once insured. Denies hypoglycemic symptoms.  Plan: Discussed continuing current regimen and attempting diet and lifestyle changes. If refractory to changes at f/u, would benefit from mealtime insulin (SGLT-2 contraindicated given hx of perineal nec fasc). Discussed setting up appointment with diabetes educator though this may need to wait until patient has obtained medicaid.

## 2022-11-13 NOTE — Progress Notes (Signed)
Internal Medicine Clinic Attending   I saw and evaluated the patient.  I personally confirmed the key portions of the history and exam documented by Dr. White and I reviewed pertinent patient test results.  The assessment, diagnosis, and plan were formulated together and I agree with the documentation in the resident's note.  

## 2022-11-25 ENCOUNTER — Encounter: Payer: Self-pay | Admitting: Student

## 2022-12-02 ENCOUNTER — Other Ambulatory Visit (HOSPITAL_COMMUNITY): Payer: Self-pay

## 2022-12-03 ENCOUNTER — Other Ambulatory Visit: Payer: Self-pay | Admitting: Internal Medicine

## 2022-12-03 ENCOUNTER — Other Ambulatory Visit: Payer: Self-pay

## 2022-12-03 ENCOUNTER — Other Ambulatory Visit (HOSPITAL_COMMUNITY): Payer: Self-pay

## 2022-12-03 DIAGNOSIS — E119 Type 2 diabetes mellitus without complications: Secondary | ICD-10-CM

## 2022-12-03 MED ORDER — LANTUS SOLOSTAR 100 UNIT/ML ~~LOC~~ SOPN
40.0000 [IU] | PEN_INJECTOR | Freq: Every day | SUBCUTANEOUS | 3 refills | Status: DC
  Filled 2022-12-03: qty 3, 7d supply, fill #0

## 2022-12-03 MED ORDER — VICTOZA 18 MG/3ML ~~LOC~~ SOPN
1.8000 mg | PEN_INJECTOR | Freq: Every day | SUBCUTANEOUS | 3 refills | Status: DC
  Filled 2022-12-03: qty 9, 30d supply, fill #0

## 2022-12-04 ENCOUNTER — Other Ambulatory Visit: Payer: Self-pay | Admitting: Student

## 2022-12-04 ENCOUNTER — Other Ambulatory Visit (HOSPITAL_COMMUNITY): Payer: Self-pay

## 2022-12-04 DIAGNOSIS — E119 Type 2 diabetes mellitus without complications: Secondary | ICD-10-CM

## 2022-12-04 MED ORDER — LANTUS SOLOSTAR 100 UNIT/ML ~~LOC~~ SOPN
40.0000 [IU] | PEN_INJECTOR | Freq: Every day | SUBCUTANEOUS | 3 refills | Status: DC
  Filled 2022-12-04: qty 12, 30d supply, fill #0

## 2022-12-08 ENCOUNTER — Encounter: Payer: Self-pay | Admitting: Student

## 2022-12-08 ENCOUNTER — Other Ambulatory Visit (HOSPITAL_COMMUNITY): Payer: Self-pay

## 2022-12-08 ENCOUNTER — Ambulatory Visit: Payer: Medicaid Other | Admitting: Student

## 2022-12-08 VITALS — BP 154/94 | HR 103 | Temp 98.2°F | Wt 322.6 lb

## 2022-12-08 DIAGNOSIS — I10 Essential (primary) hypertension: Secondary | ICD-10-CM | POA: Diagnosis present

## 2022-12-08 DIAGNOSIS — Z9641 Presence of insulin pump (external) (internal): Secondary | ICD-10-CM | POA: Diagnosis not present

## 2022-12-08 DIAGNOSIS — Z794 Long term (current) use of insulin: Secondary | ICD-10-CM | POA: Diagnosis not present

## 2022-12-08 DIAGNOSIS — E119 Type 2 diabetes mellitus without complications: Secondary | ICD-10-CM | POA: Diagnosis not present

## 2022-12-08 DIAGNOSIS — Z7984 Long term (current) use of oral hypoglycemic drugs: Secondary | ICD-10-CM

## 2022-12-08 DIAGNOSIS — G4733 Obstructive sleep apnea (adult) (pediatric): Secondary | ICD-10-CM | POA: Diagnosis not present

## 2022-12-08 MED ORDER — DEXCOM G7 RECEIVER DEVI: 0 refills | Status: DC

## 2022-12-08 MED ORDER — DEXCOM G7 SENSOR MISC
11 refills | Status: DC
  Filled 2022-12-08: qty 3, fill #0

## 2022-12-08 MED ORDER — DEXCOM G7 SENSOR MISC: 11 refills | Status: DC

## 2022-12-08 NOTE — Assessment & Plan Note (Addendum)
Patient reports working on his diet and increasing physical activity. Currently on Victoza 1.8 mg daily, metformin 1000 mg BID, and Lantus 40 units daily. His current meter is broken, he would benefit from continuous glucose monitoring. Will send in Dexcom given he has Medicaid now. He is interested in meeting with Butch Penny, referral already sent at a prior visit.   Now that he has Medicaid, could benefit with switching Victoza to Ozempic. It has been shown that Ozempic has greater weight loss compared to Victoza.  Patient would like some time before switching, would like to readdress this at next visit.  Plan -Continue Victoza, metformin and Lantus -Continue with lifestyle modifications -Dexcom sensor and receiver ordered -Consider switching to equivalent dose of Ozempic at next visit

## 2022-12-08 NOTE — Assessment & Plan Note (Signed)
BP Readings from Last 3 Encounters:  12/08/22 (!) 154/94  11/12/22 (!) 152/92  06/22/22 (!) 145/90   BP elevated today. Patient reports has not taken his lisinopril this morning. Normally takes it with a meal. Last visit, lisinopril increased to 20 from 10 mg. Tolerating well without any side effects. Discussed with him to take it next time before clinic visit.   Plan -continue lisinopril 20 mg daily -BMP today

## 2022-12-08 NOTE — Patient Instructions (Addendum)
Thank you, Scott Logan for allowing [place] to provide your care today.   Blood Pressure -still elevated but did not take lisinopril this morning -at next visit, try to take lisinopril before your appointment  Diabetes -order for Dexcom for blood sugar monitoring -referral to see Butch Penny  -discussed about switching to Ozempic, would likel to discuss it again at next visit  Sleep Apnea -referral for sleep study  I have ordered the following labs for you:  Lab Orders         BMP8+Anion Gap       Referrals ordered today:   Referral Orders         Ambulatory referral to Sleep Studies       I have ordered the following medication/changed the following medications:   Stop the following medications: Medications Discontinued During This Encounter  Medication Reason   Continuous Blood Gluc Sensor ([place]) MISC      Start the following medications: Meds ordered this encounter  Medications   DISCONTD: Continuous Blood Gluc Sensor (DEXCOM G7 SENSOR) MISC    Sig: Use as directed for blood sugar monitoring.    Dispense:  3 each    Refill:  11   Continuous Blood Gluc Sensor (DEXCOM G7 SENSOR) MISC    Sig: Use as directed for blood sugar monitoring.    Dispense:  3 each    Refill:  11   Continuous Blood Gluc Receiver (DEXCOM G7 RECEIVER) DEVI    Sig: Use as directed for blood sugar monitoring.    Dispense:  1 each    Refill:  0     Follow up: 2 months   Should you have any questions or concerns please call the internal medicine clinic at [phone].    Angelique Blonder, D.O. [place]

## 2022-12-08 NOTE — Progress Notes (Addendum)
CC: HTN f/u  HPI:  [name] is a 27 y.o. male living with a history stated below and presents today for HTN follow-up. Please see problem based assessment and plan for additional details.  Past Medical History:  Diagnosis Date   Acute otitis media 10/14/2021   Asthma    Diabetes ([place])    Hypertension    Necrotizing fasciitis of left buttock ([place]) 07/04/2020   Admitted on 8/12 for necrotizing fasciitis of perineum extending into base of scrotum, underwent debridement on 8/12, taken back to OR for possible debridement but no necrotic areas wound and just washed out. Wound culture grew MSSA, discharged on 8/17 with PO Keflex for 10 days total, also nystatin for yeast noted around wound.       Current Outpatient Medications on File Prior to Visit  Medication Sig Dispense Refill   blood glucose meter kit and supplies KIT Dispense based on patient and insurance preference. Use up to four times daily as directed. (FOR ICD-9 250.00, 250.01). 1 each 0   Continuous Blood Gluc Sensor (FREESTYLE LIBRE 2 SENSOR) MISC Use to check blood sugar at least 6 times a day 2 each 12   insulin glargine (LANTUS SOLOSTAR) 100 UNIT/ML Solostar Pen Inject 40 Units into the skin daily. 12 mL 3   Insulin Pen Needle 32G X 4 MM MISC USE AS DIRECTED TO INJECT INSULIN FOUR TIMES DAILY. 100 each 3   liraglutide (VICTOZA) 18 MG/3ML SOPN Inject 1.8 mg into the skin daily. 9 mL 3   lisinopril (ZESTRIL) 20 MG tablet Take 1 tablet (20 mg total) by mouth daily. 30 tablet 11   metFORMIN (GLUCOPHAGE) 1000 MG tablet Take 1 tablet (1,000 mg total) by mouth 2 (two) times daily with a meal. 60 tablet 5   No current facility-administered medications on file prior to visit.    Review of Systems: ROS negative except for what is noted on the assessment and plan.  Vitals:   12/08/22 0920 12/08/22 0949  BP: (!) 154/81 (!) 154/94  Pulse: (!) 107 (!) 103  Temp: 98.2 F (36.8 C)   TempSrc: Oral   SpO2: 99%    Weight: (!) 322 lb 9.6 oz (146.3 kg)     Physical Exam: Constitutional: well-appearing male sitting in chair, in no acute distress HENT: normocephalic atraumatic Cardiovascular: tachycardic Pulmonary/Chest: normal work of breathing on room air MSK: normal bulk and tone Neurological: alert & oriented x 3 Skin: warm and dry Psych: pleasant mood  Assessment & Plan:   Hypertension BP Readings from Last 3 Encounters:  12/08/22 (!) 154/94  11/12/22 (!) 152/92  06/22/22 (!) 145/90   BP elevated today. Patient reports has not taken his lisinopril this morning. Normally takes it with a meal. Last visit, lisinopril increased to 20 from 10 mg. Tolerating well without any side effects. Discussed with him to take it next time before clinic visit.   Plan -continue lisinopril 20 mg daily -BMP today  OSA (obstructive sleep apnea) Patient now has Medicaid. STOP-BANG score elevated (snoring, apnea, gender, BMI). Referral to sleep study sent.  ADDENDUM: Patient endorses loud snoring and daytime fatigue that has been going on for some time. He is concerned about episodes of apnea observed by his significant other. Has not completed formal testing for OSA due to lack of insurance. He would benefit with testing now that he has coverage.    Plan -referral to sleep study  Diabetes [facility]) Patient reports working on his diet and increasing physical  activity. Currently on Victoza 1.8 mg daily, metformin 1000 mg BID, and Lantus 40 units daily. His current meter is broken, he would benefit from continuous glucose monitoring. Will send in Dexcom given he has Medicaid now. He is interested in meeting with Butch Penny, referral already sent at a prior visit.   Now that he has Medicaid, could benefit with switching Victoza to Ozempic. It has been shown that Ozempic has greater weight loss compared to Victoza.  Patient would like some time before switching, would like to readdress this at next  visit.  Plan -Continue Victoza, metformin and Lantus -Continue with lifestyle modifications -Dexcom sensor and receiver ordered -Consider switching to equivalent dose of Ozempic at next visit    Patient discussed with Dr. Alden Hipp, D.O. [facility], PGY-1 Phone: [phone] Date 12/08/2022 Time 7:05 PM

## 2022-12-08 NOTE — Assessment & Plan Note (Addendum)
Patient now has Medicaid. STOP-BANG score elevated (snoring, apnea, gender, BMI). Referral to sleep study sent.  ADDENDUM: Patient endorses loud snoring and daytime fatigue that has been going on for some time. He is concerned about episodes of apnea observed by his significant other. Has not completed formal testing for OSA due to lack of insurance. He would benefit with testing now that he has coverage.    Plan -referral to sleep study

## 2022-12-09 ENCOUNTER — Other Ambulatory Visit: Payer: Self-pay | Admitting: Student

## 2022-12-09 DIAGNOSIS — I10 Essential (primary) hypertension: Secondary | ICD-10-CM

## 2022-12-09 LAB — BMP8+ANION GAP
Anion Gap: 13 mmol/L (ref 10.0–18.0)
BUN/Creatinine Ratio: 23 — ABNORMAL HIGH (ref 9–20)
BUN: 18 mg/dL (ref 6–20)
CO2: 19 mmol/L — ABNORMAL LOW (ref 20–29)
Calcium: 9.4 mg/dL (ref 8.7–10.2)
Chloride: 104 mmol/L (ref 96–106)
Creatinine, Ser: 0.8 mg/dL (ref 0.76–1.27)
Glucose: 241 mg/dL — ABNORMAL HIGH (ref 70–99)
Potassium: 5.7 mmol/L — ABNORMAL HIGH (ref 3.5–5.2)
Sodium: 136 mmol/L (ref 134–144)
eGFR: 125 mL/min/{1.73_m2} (ref >59)

## 2022-12-11 ENCOUNTER — Other Ambulatory Visit (INDEPENDENT_AMBULATORY_CARE_PROVIDER_SITE_OTHER): Payer: Medicaid Other

## 2022-12-11 DIAGNOSIS — I10 Essential (primary) hypertension: Secondary | ICD-10-CM | POA: Diagnosis present

## 2022-12-12 LAB — BMP8+ANION GAP
Anion Gap: 12 mmol/L (ref 10.0–18.0)
BUN/Creatinine Ratio: 17 (ref 9–20)
BUN: 12 mg/dL (ref 6–20)
CO2: 21 mmol/L (ref 20–29)
Calcium: 9 mg/dL (ref 8.7–10.2)
Chloride: 103 mmol/L (ref 96–106)
Creatinine, Ser: 0.7 mg/dL — ABNORMAL LOW (ref 0.76–1.27)
Glucose: 187 mg/dL — ABNORMAL HIGH (ref 70–99)
Potassium: 5 mmol/L (ref 3.5–5.2)
Sodium: 136 mmol/L (ref 134–144)
eGFR: 130 mL/min/{1.73_m2} (ref >59)

## 2022-12-14 NOTE — Progress Notes (Signed)
Internal Medicine Clinic Attending  Case discussed with the resident at the time of the visit.  We reviewed the resident's history and exam and pertinent patient test results.  I agree with the assessment, diagnosis, and plan of care documented in the resident's note.  

## 2022-12-18 ENCOUNTER — Telehealth: Payer: Self-pay

## 2022-12-18 NOTE — Addendum Note (Signed)
Addended by: Angelique Blonder on: 12/18/2022 03:11 PM   Modules accepted: Orders

## 2022-12-18 NOTE — Telephone Encounter (Signed)
A Prior Authorization was initiated for this patients DEXCOM G7 SENSOR through CoverMyMeds.   Key: [id number]

## 2022-12-21 NOTE — Telephone Encounter (Signed)
Prior Auth for patients medication DEXCOM G7 SENSOR denied by [place] via CoverMyMeds.   Reason:    CoverMyMeds Key: [id number]

## 2022-12-28 ENCOUNTER — Other Ambulatory Visit (HOSPITAL_COMMUNITY): Payer: Self-pay

## 2023-01-13 ENCOUNTER — Ambulatory Visit: Payer: Medicaid Other | Admitting: Neurology

## 2023-01-13 ENCOUNTER — Encounter: Payer: Self-pay | Admitting: Neurology

## 2023-01-13 VITALS — BP 155/96 | HR 94 | Ht 67.0 in | Wt 339.6 lb

## 2023-01-13 DIAGNOSIS — R0681 Apnea, not elsewhere classified: Secondary | ICD-10-CM | POA: Diagnosis not present

## 2023-01-13 DIAGNOSIS — Z6841 Body Mass Index (BMI) 40.0 and over, adult: Secondary | ICD-10-CM

## 2023-01-13 DIAGNOSIS — R0683 Snoring: Secondary | ICD-10-CM | POA: Diagnosis not present

## 2023-01-13 DIAGNOSIS — R030 Elevated blood-pressure reading, without diagnosis of hypertension: Secondary | ICD-10-CM | POA: Diagnosis not present

## 2023-01-13 NOTE — Patient Instructions (Signed)

## 2023-01-13 NOTE — Progress Notes (Signed)
Subjective:    Patient ID: Scott Logan is a 27 y.o. male.  HPI    [name], MD, PhD [facility] [address], [address] [place], [place] [address]  Dear Drs. Willeen Cass,  I saw your patient, Scott Logan, upon your kind request in my sleep clinic today for initial consultation of his sleep disorder, in particular, concern for underlying obstructive sleep apnea.  The patient is accompanied by his mother today.  As you know, Scott Logan is a 27 year old male with an underlying medical history of asthma, diabetes, hypertension, history of necrotizing fasciitis, and morbid obesity with a BMI of over 50, who reports snoring and excessive daytime somnolence as well as witnessed apneas per significant other.  His Epworth sleepiness score is 5 out of 24, fatigue severity score 24 out of 63.  He lives with his fiance.  He works as a Airline pilot.  He has no known family history of sleep apnea but his younger brother is actually suspected to have sleep apnea as well.  He wakes up with dry mouth and has had morning headaches which are described as pressure-like and dull, not migraine-like.  He has nocturia about once or twice per average night.  He drinks no caffeine daily.  He is a non-smoker and does not drink alcohol.  Bedtime is generally between 9 and 10 PM and rise time is around 7 AM.  He takes a nap occasionally.  He is working on weight loss.  He is working on improvement of his diabetes control.  His A1c has come down from when he was originally diagnosed in 2021.  I reviewed your office note from 12/08/2022.   His Past Medical History Is Significant For: Past Medical History:  Diagnosis Date   Acute otitis media 10/14/2021   Asthma    Diabetes ([place])    Hypertension    Necrotizing fasciitis of left buttock ([place]) 07/04/2020   Admitted on 8/12 for necrotizing fasciitis of perineum extending into base of scrotum, underwent debridement on 8/12,  taken back to OR for possible debridement but no necrotic areas wound and just washed out. Wound culture grew MSSA, discharged on 8/17 with PO Keflex for 10 days total, also nystatin for yeast noted around wound.       His Past Surgical History Is Significant For: Past Surgical History:  Procedure Laterality Date   ADENOIDECTOMY     DRESSING CHANGE UNDER ANESTHESIA N/A 07/06/2020   Procedure: DRESSING CHANGE UNDER ANESTHESIA WITH WASH-OUT OF PERINEUM;  Surgeon: Ralene Ok, MD;  Location: [facility];  Service: General;  Laterality: N/A;   INCISION AND DRAINAGE PERIRECTAL ABSCESS N/A 07/04/2020   Procedure: IRRIGATION AND DEBRIDEMENT PERINEAL ABSCESS;  Surgeon: Rolm Bookbinder, MD;  Location: [facility];  Service: General;  Laterality: N/A;    His Family History Is Significant For: Family History  Problem Relation Age of Onset   Kidney Stones Mother    Diabetes Sister    Diabetes Brother        prediabetes    His Social History Is Significant For: Social History   Socioeconomic History   Marital status: Single    Spouse name: Not on file   Number of children: Not on file   Years of education: Not on file   Highest education level: Not on file  Occupational History   Occupation: Photographer  Tobacco Use   Smoking status: Never   Smokeless tobacco: Never  Vaping Use   Vaping  Use: Never used  Substance and Sexual Activity   Alcohol use: Never   Drug use: Never   Sexual activity: Yes    Birth control/protection: Condom, Coitus interruptus  Other Topics Concern   Not on file  Social History Narrative   Caffiene: occasional soda (zero )   Working: self employed. Videographer.   Social Determinants of Health   Financial Resource Strain: Not on file  Food Insecurity: Not on file  Transportation Needs: Not on file  Physical Activity: Not on file  Stress: Not on file  Social Connections: Not on file    His Allergies Are:  No Known Allergies:   His Current Medications  Are:  Outpatient Encounter Medications as of 01/13/2023  Medication Sig   blood glucose meter kit and supplies KIT Dispense based on patient and insurance preference. Use up to four times daily as directed. (FOR ICD-9 250.00, 250.01).   Continuous Blood Gluc Receiver (DEXCOM G7 RECEIVER) DEVI Use as directed for blood sugar monitoring.   Continuous Blood Gluc Sensor (DEXCOM G7 SENSOR) MISC Use as directed for blood sugar monitoring.   Continuous Blood Gluc Sensor (FREESTYLE LIBRE 2 SENSOR) MISC Use to check blood sugar at least 6 times a day   insulin glargine (LANTUS SOLOSTAR) 100 UNIT/ML Solostar Pen Inject 40 Units into the skin daily.   Insulin Pen Needle 32G X 4 MM MISC USE AS DIRECTED TO INJECT INSULIN FOUR TIMES DAILY.   liraglutide (VICTOZA) 18 MG/3ML SOPN Inject 1.8 mg into the skin daily.   lisinopril (ZESTRIL) 20 MG tablet Take 1 tablet (20 mg total) by mouth daily.   metFORMIN (GLUCOPHAGE) 1000 MG tablet Take 1 tablet (1,000 mg total) by mouth 2 (two) times daily with a meal.   No facility-administered encounter medications on file as of 01/13/2023.  :   Review of Systems:  Out of a complete 14 point review of systems, all are reviewed and negative with the exception of these symptoms as listed below:  Review of Systems  Neurological:        Loud snoring and daytime fatigue that has been going on for some time. He is concerned about episodes of apnea observed by his significant other.  ESS 5 FSS 24.    Objective:  Neurological Exam  Physical Exam Physical Examination:   Vitals:   01/13/23 1119 01/13/23 1140  BP: (!) 159/94 (!) 155/96  Pulse: 99 94    General Examination: The patient is a very pleasant 27 y.o. male in no acute distress. He appears well-developed and well-nourished and well groomed.   HEENT: Normocephalic, atraumatic, pupils are equal, round and reactive to light, corrective eyeglasses in place.  Extraocular tracking is good without limitation to gaze  excursion or nystagmus noted. Hearing is grossly intact. Face is symmetric with normal facial animation. Speech is clear with no dysarthria noted. There is no hypophonia. There is no lip, neck/head, jaw or voice tremor. Neck is supple with full range of passive and active motion. There are no carotid bruits on auscultation. Oropharynx exam reveals: No significant mouth dryness, good dental hygiene, significant airway crowding secondary to larger uvula, tonsillar size of about 1+, Mallampati class III, slightly wider tongue.  Tongue protrudes centrally and palate elevates symmetrically, neck circumference of 17 inches.  Minimal overbite noted.   Chest: Clear to auscultation without wheezing, rhonchi or crackles noted.  Heart: S1+S2+0, regular and normal without murmurs, rubs or gallops noted.   Abdomen: Soft, non-tender and non-distended.  Extremities: There  is no pitting edema in the distal lower extremities bilaterally.   Skin: Warm and dry without trophic changes noted.   Musculoskeletal: exam reveals no obvious joint deformities.   Neurologically:  Mental status: The patient is awake, alert and oriented in all 4 spheres. Her immediate and remote memory, attention, language skills and fund of knowledge are appropriate. There is no evidence of aphasia, agnosia, apraxia or anomia. Speech is clear with normal prosody and enunciation. Thought process is linear. Mood is normal and affect is normal.  Cranial nerves II - XII are as described above under HEENT exam.  Motor exam: Normal bulk, strength and tone is noted. There is no obvious action or resting tremor.  Fine motor skills and coordination: grossly intact.  Cerebellar testing: No dysmetria or intention tremor. There is no truncal or gait ataxia.  Sensory exam: intact to light touch in the upper and lower extremities.  Gait, station and balance: He stands easily. No veering to one side is noted. No leaning to one side is noted. Posture is  age-appropriate and stance is narrow based. Gait shows normal stride length and normal pace. No problems turning are noted.   Assessment and Plan:  In summary, BRAVE FORGEY is a very pleasant 27 y.o.-year old male with an underlying medical history of asthma, diabetes, hypertension, history of necrotizing fasciitis, and morbid obesity with a BMI of over 50, whose history and physical exam are concerning for sleep disordered breathing, particularly obstructive sleep apnea (OSA). While a lab attended sleep study is typically considered "gold standard" for evaluation of sleep disordered breathing, I recommend a home sleep test for more timely evaluation and treatment. I had a long chat with the patient and his mother about my findings and the diagnosis of sleep apnea, particularly OSA, its prognosis and treatment options. We talked about medical/conservative treatments, surgical interventions and non-pharmacological approaches for symptom control. I explained, in particular, the risks and ramifications of untreated moderate to severe OSA, especially with respect to developing cardiovascular disease down the road, including congestive heart failure (CHF), difficult to treat hypertension, cardiac arrhythmias (particularly A-fib), neurovascular complications including TIA, stroke and dementia. Even type 2 diabetes has, in part, been linked to untreated OSA. Symptoms of untreated OSA may include (but may not be limited to) daytime sleepiness, nocturia (i.e. frequent nighttime urination), memory problems, mood irritability and suboptimally controlled or worsening mood disorder such as depression and/or anxiety, lack of energy, lack of motivation, physical discomfort, as well as recurrent headaches, especially morning or nocturnal headaches. We talked about the importance of maintaining a healthy lifestyle and striving for healthy weight.  In addition, we talked about the importance of striving for and maintaining  good sleep hygiene. I recommended a sleep study at this time. I outlined the differences between a laboratory attended sleep study which is considered more comprehensive and accurate over the option of a home sleep test (HST); the latter may lead to underestimation of sleep disordered breathing in some instances and does not help with diagnosing upper airway resistance syndrome and is not accurate enough to diagnose primary central sleep apnea typically. I outlined possible surgical and non-surgical treatment options of OSA, including the use of a positive airway pressure (PAP) device (i.e. CPAP, AutoPAP/APAP or BiPAP in certain circumstances), a custom-made dental device (aka oral appliance, which would require a referral to a specialist dentist or orthodontist typically, and is generally speaking not considered for patients with full dentures or edentulous state), upper airway surgical  options, such as traditional UPPP (which is not considered a first-line treatment) or the Inspire device (hypoglossal nerve stimulator, which would involve a referral for consultation with an ENT surgeon, after careful selection, following inclusion criteria - also not first-line treatment). I explained the PAP treatment option to the patient in detail, as this is generally considered first-line treatment.  The patient indicated that he would be willing to try PAP therapy, if the need arises. I explained the importance of being compliant with PAP treatment, not only for insurance purposes but primarily to improve patient's symptoms symptoms, and for the patient's long term health benefit, including to reduce His cardiovascular risks longer-term.    We will pick up our discussion about the next steps and treatment options after testing.  We will keep him posted as to the test results by phone call and/or MyChart messaging where possible.  We will plan to follow-up in sleep clinic accordingly as well.  I answered all their  questions today and the patient and his mother were in agreement.   I encouraged them to call with any interim questions, concerns, problems or updates or email [place] through [place].  Generally speaking, sleep test authorizations may take up to 2 weeks, sometimes less, sometimes longer, the patient is encouraged to get in touch with [place] if they do not hear back from the sleep lab staff directly within the next 2 weeks.  Thank you very much for allowing me to participate in the care of this nice patient. If I can be of any further assistance to you please do not hesitate to call me at [phone].  Sincerely,   [name], MD, PhD

## 2023-01-14 LAB — HM DIABETES EYE EXAM

## 2023-01-28 ENCOUNTER — Encounter: Payer: Self-pay | Admitting: Dietician

## 2023-02-03 ENCOUNTER — Telehealth: Payer: Self-pay

## 2023-02-03 ENCOUNTER — Other Ambulatory Visit: Payer: Self-pay

## 2023-02-03 ENCOUNTER — Ambulatory Visit (INDEPENDENT_AMBULATORY_CARE_PROVIDER_SITE_OTHER): Payer: Medicaid Other | Admitting: Dietician

## 2023-02-03 ENCOUNTER — Telehealth: Payer: Self-pay | Admitting: Neurology

## 2023-02-03 ENCOUNTER — Other Ambulatory Visit: Payer: Self-pay | Admitting: Dietician

## 2023-02-03 ENCOUNTER — Encounter: Payer: Self-pay | Admitting: Internal Medicine

## 2023-02-03 ENCOUNTER — Ambulatory Visit: Payer: Medicaid Other | Admitting: Internal Medicine

## 2023-02-03 VITALS — BP 127/78 | HR 102 | Temp 98.7°F | Resp 28 | Ht 67.0 in | Wt 347.1 lb

## 2023-02-03 DIAGNOSIS — Z7984 Long term (current) use of oral hypoglycemic drugs: Secondary | ICD-10-CM

## 2023-02-03 DIAGNOSIS — Z9641 Presence of insulin pump (external) (internal): Secondary | ICD-10-CM | POA: Diagnosis not present

## 2023-02-03 DIAGNOSIS — E119 Type 2 diabetes mellitus without complications: Secondary | ICD-10-CM | POA: Diagnosis not present

## 2023-02-03 DIAGNOSIS — Z794 Long term (current) use of insulin: Secondary | ICD-10-CM

## 2023-02-03 DIAGNOSIS — I10 Essential (primary) hypertension: Secondary | ICD-10-CM | POA: Diagnosis not present

## 2023-02-03 DIAGNOSIS — Z0000 Encounter for general adult medical examination without abnormal findings: Secondary | ICD-10-CM

## 2023-02-03 LAB — POCT GLYCOSYLATED HEMOGLOBIN (HGB A1C): Hemoglobin A1C: 8.2 % — AB (ref 4.0–5.6)

## 2023-02-03 LAB — GLUCOSE, CAPILLARY: Glucose-Capillary: 193 mg/dL — ABNORMAL HIGH (ref 70–99)

## 2023-02-03 MED ORDER — ACCU-CHEK GUIDE W/DEVICE KIT: 1.0000 | PACK | Freq: Three times a day (TID) | 1 refills | Status: DC

## 2023-02-03 MED ORDER — DEXCOM G7 SENSOR MISC: 11 refills | Status: DC

## 2023-02-03 MED ORDER — ACCU-CHEK SOFTCLIX LANCETS MISC: 1 refills | Status: DC

## 2023-02-03 MED ORDER — LIRAGLUTIDE 18 MG/3ML ~~LOC~~ SOPN: 2.4000 mg | PEN_INJECTOR | Freq: Every day | SUBCUTANEOUS | 3 refills | Status: DC

## 2023-02-03 MED ORDER — ACCU-CHEK GUIDE VI STRP: ORAL_STRIP | 12 refills | Status: DC

## 2023-02-03 MED ORDER — DEXCOM G7 RECEIVER DEVI: 0 refills | Status: DC

## 2023-02-03 NOTE — Patient Instructions (Signed)
Thank you for your visit today!  We talked about:   Continuous glucose monitoring Checking blood sugar around meals to know what portions of food  work with your body  How eating more fruits, vegetables and whole grain (higher fiber foods)  may help your body to produce more of it's own GLP-1   Goals to work on:   Checking blood sugars before and after one meal a day and record what you eat and drink for that meal- both in detail about what you ate and how much you ate and also in carb grams is fine.   Continue working on the above meal checking before an dafter for about 1 week before moving on to check around another meal.   Please feel free to call me anytime.  Butch Penny [phone]

## 2023-02-03 NOTE — Telephone Encounter (Signed)
[facility] healthy blue pending uploaded notes on the portal  

## 2023-02-03 NOTE — Progress Notes (Signed)
Wt Readings from Last 20 Encounters:  02/03/23 (!) 347 lb 1.6 oz (157.4 kg)  01/13/23 (!) 339 lb 9.6 oz (154 kg)  12/08/22 (!) 322 lb 9.6 oz (146.3 kg)  11/12/22 (!) 354 lb 3.2 oz (160.7 kg)  06/22/22 (!) 351 lb 11.2 oz (159.5 kg)  06/02/22 (!) 342 lb 14.4 oz (155.5 kg)  04/01/22 (!) 347 lb 9.6 oz (157.7 kg)  03/04/22 (!) 348 lb 11.2 oz (158.2 kg)  10/14/21 (!) 338 lb 6.4 oz (153.5 kg)  08/12/21 (!) 319 lb 4.8 oz (144.8 kg)  03/24/21 (!) 311 lb 9.6 oz (141.3 kg)  08/21/20 299 lb (135.6 kg)  08/07/20 294 lb 4.8 oz (133.5 kg)  07/17/20 295 lb 3.2 oz (133.9 kg)  07/12/20 298 lb 6.4 oz (135.4 kg)  07/04/20 299 lb 6.6 oz (135.8 kg)  09/06/18 (!) 330 lb (149.7 kg)   Lab Results  Component Value Date   HGBA1C 8.2 (A) 02/03/2023   HGBA1C 8.8 (A) 11/12/2022   HGBA1C 7.8 (A) 06/22/2022   HGBA1C 9.5 (A) 03/04/2022   HGBA1C 8.0 (A) 08/12/2021   Diabetes Self-Management Education  Visit Type:  Follow-up  Appt. Start Time: 1015 Appt. End Time: 1115  02/03/2023  [name], identified by name and date of birth, is a 27 y.o. male with a diagnosis of Diabetes:  .   ASSESSMENT Estimated body mass index is 54.36 kg/m as calculated from the following:   Height as of an earlier encounter on 02/03/23: '5\' 7"'$  (1.702 m).   Weight as of an earlier encounter on 02/03/23: 347 lb 1.6 oz (157.4 kg).  Patient is very interested in CGM instead of SMBG. Unsure if his insurance will cover. He is also interested in using a once a week GLP0-1 instead of a daily GLP-1. He needs a meter at least as a back up for his CGM a sample CGM was provided today. He was educated about     Diabetes Self-Management Education - 02/03/23 1600       Health Coping   How would you rate your overall health? Good      Psychosocial Assessment   Patient Belief/Attitude about Diabetes Motivated to manage diabetes    What is the hardest part about your diabetes right now, causing you the most concern, or is the most  worrisome to you about your diabetes?   Making healty food and beverage choices;Getting support / problem solving    Self-care barriers Lack of material resources    Self-management support Family;Friends;CDE visits    Patient Concerns Monitoring;Medication;Glycemic Control;Weight Control    Special Needs None    Preferred Learning Style No preference indicated    Learning Readiness Ready      Pre-Education Assessment   Patient understands the diabetes disease and treatment process. Comprehends key points    Patient understands incorporating nutritional management into lifestyle. Comprehends key points    Patient undertands incorporating physical activity into lifestyle. Comprehends key points    Patient understands using medications safely. Needs Review    Patient understands monitoring blood glucose, interpreting and using results Needs Review    Patient understands prevention, detection, and treatment of acute complications. Needs Review    Patient understands prevention, detection, and treatment of chronic complications. Needs Review    Patient understands how to develop strategies to address psychosocial issues. Demonstrates understanding / competency    Patient understands how to develop strategies to promote health/change behavior. Needs Review      Complications  Last HgB A1C per patient/outside source 8.2 %    How often do you check your blood sugar? 0 times/day (not testing)    Number of hypoglycemic episodes per month 0    Number of hyperglycemic episodes ( >'200mg'$ /dL): --   unknown   Have you had a dilated eye exam in the past 12 months? Yes    Have you had a dental exam in the past 12 months? Yes   today   Are you checking your feet? No      Dietary Intake   Breakfast wants to find a happy middle ground less strict that he was doing when diagnosed and less portions that he has been recently doing      Activity / Exercise   Activity / Exercise Type --   unknown      Patient Education   Previous Diabetes Education Yes (please comment)   did not complete due to no health insurance   Medications Reviewed patients medication for diabetes, action, purpose, timing of dose and side effects.    Monitoring Taught/evaluated CGM (comment);Purpose and frequency of SMBG.;Taught/discussed recording of test results and interpretation of SMBG.;Identified appropriate SMBG and/or A1C goals.    Lifestyle and Health Coping Helped patient develop diabetes management plan for (enter comment)   figuring out how much he can eat and still keep blood sugar in check using meter and dexcom     Individualized Goals (developed by patient)   Monitoring  Test my blood glucose as discussed;Consistenly use CGM;Test blood glucose pre and post meals as discussed      Outcomes   Program Status Re-entered      Subsequent Visit   Since your last visit have you continued or begun to take your medications as prescribed? Yes    Since your last visit have you had your blood pressure checked? Yes    Is your most recent blood pressure lower, unchanged, or higher since your last visit? Lower    Since your last visit have you experienced any weight changes? Gain    Weight Gain (lbs) 49    Since your last visit, are you checking your blood glucose at least once a day? No   does not have a meter currently            Learning Objective:  Patient will have a greater understanding of diabetes self-management. Patient education plan is to attend individual and/or group sessions per assessed needs and concerns.   Plan:   There are no Patient Instructions on file for this visit.   Expected Outcomes:  Demonstrated interest in learning. Expect positive outcomes  Education material provided: Diabetes Resources  If problems or questions, patient to contact team via:  Phone or email  Future DSME appointment: - 2 wks Debera Lat, RD 02/03/2023 4:58 PM.

## 2023-02-03 NOTE — Telephone Encounter (Signed)
Pa for pt ( Scott Logan )  came through on cover my meds was submitted with last office notes and labs ...       DECISION : (RECEIVER  )   Approved  today  PA Case: [id number], Status: Approved,   Coverage Starts on: 02/03/2023 12:00:00 AM, Coverage Ends on: 08/02/2023 12:00:00 AM. Authorization Expiration Date: 08/01/2023   Drug Dexcom G7 receiver  ePA cloud logo  Form [facility] [place] Electronic [place] Form [phone] NCPDP) Original Claim Info    DECISION :  (SENSOR  )   Additional Information Required Available without authorization. Drug Dexcom G7 Sensor ePA cloud logo Form     ( COPY SENT TO PHARMACY AND PLACED TO SCAN TO CHART ALSO )

## 2023-02-03 NOTE — Progress Notes (Signed)
CC: HTN & DM follow up   HPI:  [name] is a 27 y.o. male living with a history stated below and presents today for a follow-up of his hypertension and diabetes. Please see problem based assessment and plan for additional details.  Past Medical History:  Diagnosis Date   Acute otitis media 10/14/2021   Asthma    Diabetes ([place] [facility])    Hypertension    Necrotizing fasciitis of left buttock ([place]) 07/04/2020   Admitted on 8/12 for necrotizing fasciitis of perineum extending into base of scrotum, underwent debridement on 8/12, taken back to OR for possible debridement but no necrotic areas wound and just washed out. Wound culture grew MSSA, discharged on 8/17 with PO Keflex for 10 days total, also nystatin for yeast noted around wound.       Current Outpatient Medications on File Prior to Visit  Medication Sig Dispense Refill   blood glucose meter kit and supplies KIT Dispense based on patient and insurance preference. Use up to four times daily as directed. (FOR ICD-9 250.00, 250.01). 1 each 0   insulin glargine (LANTUS SOLOSTAR) 100 UNIT/ML Solostar Pen Inject 40 Units into the skin daily. 12 mL 3   Insulin Pen Needle 32G X 4 MM MISC USE AS DIRECTED TO INJECT INSULIN FOUR TIMES DAILY. 100 each 3   lisinopril (ZESTRIL) 20 MG tablet Take 1 tablet (20 mg total) by mouth daily. 30 tablet 11   metFORMIN (GLUCOPHAGE) 1000 MG tablet Take 1 tablet (1,000 mg total) by mouth 2 (two) times daily with a meal. 60 tablet 5   No current facility-administered medications on file prior to visit.    Family History  Problem Relation Age of Onset   Kidney Stones Mother    Diabetes Sister    Diabetes Brother        prediabetes    Social History   Socioeconomic History   Marital status: Single    Spouse name: Not on file   Number of children: Not on file   Years of education: Not on file   Highest education level: Not on file  Occupational History   Occupation: Photographer  Tobacco  Use   Smoking status: Never   Smokeless tobacco: Never  Vaping Use   Vaping Use: Never used  Substance and Sexual Activity   Alcohol use: Never   Drug use: Never   Sexual activity: Yes    Birth control/protection: Condom, Coitus interruptus  Other Topics Concern   Not on file  Social History Narrative   Caffiene: occasional soda (zero )   Working: self employed. Videographer.   Social Determinants of Health   Financial Resource Strain: Not on file  Food Insecurity: No Food Insecurity (02/03/2023)   Hunger Vital Sign    Worried About Running Out of Food in the Last Year: Never true    Ran Out of Food in the Last Year: Never true  Transportation Needs: No Transportation Needs (02/03/2023)   PRAPARE - Hydrologist (Medical): No    Lack of Transportation (Non-Medical): No  Physical Activity: Not on file  Stress: Not on file  Social Connections: Moderately Isolated (02/03/2023)   Social Connection and Isolation Panel [NHANES]    Frequency of Communication with Friends and Family: More than three times a week    Frequency of Social Gatherings with Friends and Family: More than three times a week    Attends Religious Services: Never    Active Member  of Clubs or Organizations: No    Attends Archivist Meetings: Never    Marital Status: Living with partner  Intimate Partner Violence: Not At Risk (02/03/2023)   Humiliation, Afraid, Rape, and Kick questionnaire    Fear of Current or Ex-Partner: No    Emotionally Abused: No    Physically Abused: No    Sexually Abused: No    Review of Systems: ROS negative except for what is noted on the assessment and plan.  Vitals:   02/03/23 0928  BP: 127/78  Pulse: (!) 102  Resp: (!) 28  Temp: 98.7 F (37.1 C)  TempSrc: Oral  SpO2: 98%  Weight: (!) 347 lb 1.6 oz (157.4 kg)  Height: '5\' 7"'$  (1.702 m)    Physical Exam: Constitutional: well-appearing male sitting in chair, in no acute  distress Cardiovascular: regular rate and rhythm, no m/r/g Pulmonary/Chest: normal work of breathing on room air, lungs clear to auscultation bilaterally Abdominal: soft, non-tender, non-distended Neurological: alert & oriented x 3, no focal deficit Skin: warm and dry Psych: normal mood and behavior  Assessment & Plan:    Patient discussed with Dr. Saverio Danker  Diabetes [facility]) A1c improved from 8.8% to 8.2% today. He is on Victoza 1.8 mg daily, metformin 1 g bid, and lantus 40u daily. Of note, he has not had a meter in >1 month, thus has not been able to check his sugar levels. Denies any sxs of hypoglycemia at this time and he has been adherent with his meds.  Plan: - Continue lantus 40u daily + metformin 1 g bid - Increase victoza to 2.4 mg daily - Sent in new rx for Dexcom - Follow up in 3 months w/ meter    Hypertension BP Readings from Last 3 Encounters:  02/03/23 127/78  01/13/23 (!) 155/96  12/08/22 (!) 154/94   BP well controlled at 127/78. Continue lisinopril 20 mg daily.   Healthcare maintenance Provided patient with info on HPV vaccine.    Buddy Duty, D.O. [facility], PGY-2 Phone: [phone] Date 02/03/2023 Time 9:57 AM

## 2023-02-03 NOTE — Addendum Note (Signed)
Addended by: Buddy Duty on: 02/03/2023 04:11 PM   Modules accepted: Orders

## 2023-02-03 NOTE — Assessment & Plan Note (Addendum)
A1c improved from 8.8% to 8.2% today. He is on Victoza 1.8 mg daily, metformin 1 g bid, and lantus 40u daily. Of note, he has not had a meter in >1 month, thus has not been able to check his sugar levels. Denies any sxs of hypoglycemia at this time and he has been adherent with his meds.  Plan: - Continue lantus 40u daily + metformin 1 g bid - Increase victoza to 2.4 mg daily - Sent in new rx for Dexcom - Follow up in 3 months w/ meter  Addendum 3/14: Victoza 2.4 mg dose not covered by the patient's insurance. Will send in rx for Ozempic 0.25 mg/week x 4 weeks, followed by 0.5 mg/week (preferred medication).

## 2023-02-03 NOTE — Assessment & Plan Note (Signed)
Provided patient with info on HPV vaccine.

## 2023-02-03 NOTE — Assessment & Plan Note (Signed)
BP Readings from Last 3 Encounters:  02/03/23 127/78  01/13/23 (!) 155/96  12/08/22 (!) 154/94   BP well controlled at 127/78. Continue lisinopril 20 mg daily.

## 2023-02-03 NOTE — Patient Instructions (Signed)
Thank you, Mr.Emmaus U Fogg for allowing [place] to provide your care today. Today we discussed:  Blood pressure: keep taking your lisinopril everyday! Your blood pressure looked great today  Diabetes: Keep taking lantus 40 units daily and  metformin twice a day. We are increasing your victoza to 2.4 mg daily (was 1.8). This new dose will be at your pharmacy and I have also sent a new meter there. Please bring this meter to your next office visit in 3 months  Discussed HPV vaccine today - more info is attached to this paperwork   I have ordered the following labs for you:   Lab Orders         Glucose, capillary         POC Hbg A1C        Referrals ordered today:   Referral Orders  No referral(s) requested today     I have ordered the following medication/changed the following medications:   Stop the following medications: Medications Discontinued During This Encounter  Medication Reason   Continuous Blood Gluc Sensor (FREESTYLE LIBRE 2 SENSOR) MISC    liraglutide (VICTOZA) 18 MG/3ML SOPN Dose change   Continuous Blood Gluc Sensor (DEXCOM G7 SENSOR) MISC    Continuous Blood Gluc Receiver ([place]) [place] Reorder     Start the following medications: Meds ordered this encounter  Medications   Continuous Blood Gluc Receiver ([place]) DEVI    Sig: Use as directed for blood sugar monitoring.    Dispense:  1 each    Refill:  0   Continuous Blood Gluc Sensor (DEXCOM G7 SENSOR) MISC    Sig: Use as directed for blood sugar monitoring.    Dispense:  3 each    Refill:  11   liraglutide (VICTOZA) 18 MG/3ML SOPN    Sig: Inject 2.4 mg into the skin daily.    Dispense:  3 mL    Refill:  3     Follow up: 3 months for diabetes    Should you have any questions or concerns please call the internal medicine clinic at [phone].     Buddy Duty, D.O. [place]

## 2023-02-03 NOTE — Telephone Encounter (Signed)
Request meter and supplies

## 2023-02-04 MED ORDER — SEMAGLUTIDE(0.25 OR 0.5MG/DOS) 2 MG/3ML ~~LOC~~ SOPN: PEN_INJECTOR | SUBCUTANEOUS | 3 refills | Status: AC

## 2023-02-04 NOTE — Addendum Note (Signed)
Addended by: Buddy Duty on: 02/04/2023 03:41 PM   Modules accepted: Orders

## 2023-02-05 ENCOUNTER — Telehealth: Payer: Self-pay

## 2023-02-05 NOTE — Progress Notes (Signed)
Internal Medicine Clinic Attending ° °Case discussed with Dr. Atway  At the time of the visit.  We reviewed the resident’s history and exam and pertinent patient test results.  I agree with the assessment, diagnosis, and plan of care documented in the resident’s note.  °

## 2023-02-05 NOTE — Addendum Note (Signed)
Addended by: Charise Killian on: 02/05/2023 11:01 AM   Modules accepted: Level of Service

## 2023-02-05 NOTE — Telephone Encounter (Signed)
Error

## 2023-02-05 NOTE — Telephone Encounter (Signed)
Prior Authorization for patient (ozempic) came through on cover my meds was submitted with last office notes and labs awaiting approval or denial. 

## 2023-02-08 ENCOUNTER — Other Ambulatory Visit: Payer: Self-pay

## 2023-02-08 DIAGNOSIS — E119 Type 2 diabetes mellitus without complications: Secondary | ICD-10-CM

## 2023-02-08 MED ORDER — INSULIN PEN NEEDLE 32G X 4 MM MISC: 3 refills | Status: DC

## 2023-02-08 NOTE — Telephone Encounter (Signed)
Decision:Denied Scott Logan (Key: [id number]) Ozempic (0.25 or 0.5 MG/DOSE) 2MG /3ML pen-injectors Form [facility] [facility] Electronic [place] Form [phone] NCPDP) Created  Message from Plan PA Case: [id number], Status: Denied. Notification: Completed.

## 2023-02-08 NOTE — Telephone Encounter (Signed)
Checked the status on the portal it is still pending.  

## 2023-02-08 NOTE — Telephone Encounter (Signed)
Appeal has been faxed to patients insurance [phone] awaiting approval or denial.

## 2023-02-09 ENCOUNTER — Encounter: Payer: Medicaid Other | Admitting: Dietician

## 2023-02-09 NOTE — Telephone Encounter (Signed)
HST- MCD healthy blue no auth req via fax form.   Patient is scheduled at [facility] for 03/02/23 at 1:30 pm.  Mailed packet to the patient.

## 2023-02-15 ENCOUNTER — Other Ambulatory Visit: Payer: Self-pay | Admitting: Internal Medicine

## 2023-02-15 ENCOUNTER — Telehealth: Payer: Self-pay

## 2023-02-15 NOTE — Telephone Encounter (Signed)
Hey Dr.Atway I haven't received anything from his insurance, I did submit a appeal last week awaiting approval or denial

## 2023-02-15 NOTE — Telephone Encounter (Signed)
Pt seen on 02/03/2023 and has not been able to get the following medication:   Semaglutide,0.25 or 0.5MG /DOS, 2 MG/3ML SOPN   [id number] - [place] [place] [address]. AT [address]

## 2023-02-15 NOTE — Telephone Encounter (Signed)
Decision:Approved Scott Logan (Key: [id number]) Rx #: [id number] Ozempic (0.25 or 0.5 MG/DOSE) 2MG [name] pen-injectors Form [facility] Electronic [place] Form [phone] NCPDP) Created  Message from Plan PA Case: [id number], Status: Approved, Coverage Starts on: 02/15/2023 12:00:00 AM, Coverage Ends on: 02/15/2024 12:00:00 AM.. Authorization Expiration Date: February 15, 2024.

## 2023-02-15 NOTE — Telephone Encounter (Signed)
Prior Authorization for patient (ozempic) came through on cover my meds was submitted with last office notes and labs awaiting approval or denial. 

## 2023-02-23 ENCOUNTER — Encounter: Payer: Medicaid Other | Admitting: Dietician

## 2023-03-02 ENCOUNTER — Ambulatory Visit: Payer: Medicaid Other | Admitting: Neurology

## 2023-03-02 DIAGNOSIS — R030 Elevated blood-pressure reading, without diagnosis of hypertension: Secondary | ICD-10-CM

## 2023-03-02 DIAGNOSIS — G4733 Obstructive sleep apnea (adult) (pediatric): Secondary | ICD-10-CM | POA: Diagnosis not present

## 2023-03-02 DIAGNOSIS — R0683 Snoring: Secondary | ICD-10-CM

## 2023-03-02 DIAGNOSIS — R0681 Apnea, not elsewhere classified: Secondary | ICD-10-CM

## 2023-03-02 DIAGNOSIS — G4734 Idiopathic sleep related nonobstructive alveolar hypoventilation: Secondary | ICD-10-CM

## 2023-03-04 NOTE — Procedures (Signed)
[facility]  HOME SLEEP TEST (Watch PAT) REPORT  STUDY DATE: 03/03/23  DOB: Apr 06, 1996  MRN: [id number]  ORDERING CLINICIAN: Huston Foley, MD, PhD   REFERRING CLINICIAN: Chauncey Mann, DO   CLINICAL INFORMATION/HISTORY: 27 year old male with an underlying medical history of asthma, diabetes, hypertension, history of necrotizing fasciitis, and morbid obesity with a BMI of over 50, who reports snoring and excessive daytime somnolence as well as witnessed apneas.  Epworth sleepiness score: 5/24.  BMI: 53.3 kg/m  FINDINGS:   Sleep Summary:   Total Recording Time (hours, min): 7 hours, 45 min  Total Sleep Time (hours, min):  7 hours, 3 min  Percent REM (%):    35.5%   Respiratory Indices:   Calculated pAHI (per hour):  22.8/hour     - utilizing the 4% desaturation criteria for hypopneas per Medicaid guidelines  REM pAHI:    39.8/hour       NREM pAHI: 13.7/hour  Central pAHI: 0/hour  Oxygen Saturation Statistics:    Oxygen Saturation (%) Mean: 95%   Minimum oxygen saturation (%):                 63%   O2 Saturation Range (%): 63-99%    O2 Saturation (minutes) <=88%: 14.4 min  Pulse Rate Statistics:   Pulse Mean (bpm):    90/min    Pulse Range (73 - 115/min)   IMPRESSION: OSA (obstructive sleep apnea), moderate Nocturnal Hypoxemia  RECOMMENDATION:  This home sleep test demonstrates moderate obstructive sleep apnea with a total AHI of 22.8/hour and O2 nadir of 63% with significant time below or at 88% saturation of over 10 minutes of the night, indicating nocturnal hypoxemia.  Snoring was detected in the mild to moderate range, rarely louder. Treatment with a positive airway pressure (PAP) device is recommended. The patient will be advised to proceed with an autoPAP titration/trial at home for now. A full night titration study may be considered to optimize treatment settings, monitor proper oxygen saturations and aid with improvement of  tolerance and adherence, if needed down the road. Alternative treatment options may include a dental device through dentistry or orthodontics in selected patients or Inspire (hypoglossal nerve stimulator) in carefully selected patients (meeting inclusion criteria).  Concomitant weight loss is recommended (where clinically appropriate). Please note that untreated obstructive sleep apnea may carry additional perioperative morbidity. Patients with significant obstructive sleep apnea should receive perioperative PAP therapy and the surgeons and particularly the anesthesiologist should be informed of the diagnosis and the severity of the sleep disordered breathing. The patient should be cautioned not to drive, work at heights, or operate dangerous or heavy equipment when tired or sleepy. Review and reiteration of good sleep hygiene measures should be pursued with any patient. Other causes of the patient's symptoms, including circadian rhythm disturbances, an underlying mood disorder, medication effect and/or an underlying medical problem cannot be ruled out based on this test. Clinical correlation is recommended.  The patient and his referring provider will be notified of the test results. The patient will be seen in follow up in sleep clinic at [facility].  I certify that I have reviewed the raw data recording prior to the issuance of this report in accordance with the standards of the American Academy of Sleep Medicine (AASM).    INTERPRETING PHYSICIAN:   Huston Foley, MD, PhD Medical Director, [facility]) Diplomat, ABPN (Neurology and Sleep)   [facility] [address], [address] [place],  [place] [address] [phone]

## 2023-03-04 NOTE — Addendum Note (Signed)
Addended by: Huston Foley on: 03/04/2023 06:53 PM   Modules accepted: Orders

## 2023-03-09 ENCOUNTER — Telehealth: Payer: Self-pay | Admitting: *Deleted

## 2023-03-09 NOTE — Telephone Encounter (Signed)
Spoke to patient gave sleep study results Pt chose [facility] for DME Gave patient # to [facility] Informed patient of insurance compliance. wear Autopap machine  4+ hours nightly and come to all f/u appointments . made initial f/u with Megan,NP July 2024 Faxed over orders to [facility] this afternoom and forward sleep study results to PCP Pt expressed understanding and thanked me for calling

## 2023-03-09 NOTE — Telephone Encounter (Signed)
-----   Message from Huston Foley, MD sent at 03/04/2023  6:53 PM EDT ----- Patient referred by PCP, seen by me on 01/13/2023, patient had a HST on 03/03/2023.    Please call and notify the patient that the recent home sleep test showed obstructive sleep apnea in the moderate range but with severe oxygen desaturations repeatedly. I recommend treatment in the form of autoPAP, which means, that we don't have to bring him in for a sleep study with CPAP, but will let him start using a so called autoPAP machine at home, which is a CPAP-like machine with self-adjusting pressures. We will send the order to a local DME company (of his choice, or as per insurance requirement). The DME representative will fit him with a mask, educate him on how to use the machine, how to put the mask on, etc. I have placed an order in the chart. Please send the order, talk to patient, send report to referring MD. We will need a FU in sleep clinic for 10 weeks post-PAP set up, please arrange that with me or one of our NPs. Also reinforce the need for compliance with treatment. Thanks,   Huston Foley, MD, PhD [facility])

## 2023-03-29 ENCOUNTER — Other Ambulatory Visit: Payer: Self-pay | Admitting: Student

## 2023-03-29 DIAGNOSIS — E119 Type 2 diabetes mellitus without complications: Secondary | ICD-10-CM

## 2023-04-06 ENCOUNTER — Other Ambulatory Visit: Payer: Self-pay | Admitting: Student

## 2023-04-06 DIAGNOSIS — E119 Type 2 diabetes mellitus without complications: Secondary | ICD-10-CM

## 2023-04-07 ENCOUNTER — Telehealth: Payer: Self-pay | Admitting: *Deleted

## 2023-04-07 NOTE — Telephone Encounter (Signed)
Pt stated his Lantus was refilled but the pharmacy gave him semglee. He wants to know if they are the same or safe to take or substituted by the pharmacy? I told the pt to call the pharmacy b/c they might had substituted the insulin. Stated he will call the pharmacy.  I will also ask Lupita Leash to f/u.

## 2023-04-07 NOTE — Telephone Encounter (Signed)
Eman had already called his pharmacy. He asks [place] to make him appointments in the after noon after 05/06/23 for both myself and the doctor.

## 2023-05-10 ENCOUNTER — Ambulatory Visit: Payer: Medicaid Other | Admitting: Student

## 2023-05-10 ENCOUNTER — Other Ambulatory Visit: Payer: Self-pay | Admitting: Student

## 2023-05-10 ENCOUNTER — Ambulatory Visit: Payer: Medicaid Other | Admitting: Dietician

## 2023-05-10 VITALS — BP 141/102 | HR 104 | Temp 98.0°F | Ht 67.0 in | Wt 348.7 lb

## 2023-05-10 DIAGNOSIS — G4733 Obstructive sleep apnea (adult) (pediatric): Secondary | ICD-10-CM | POA: Diagnosis not present

## 2023-05-10 DIAGNOSIS — I10 Essential (primary) hypertension: Secondary | ICD-10-CM

## 2023-05-10 DIAGNOSIS — E119 Type 2 diabetes mellitus without complications: Secondary | ICD-10-CM | POA: Diagnosis present

## 2023-05-10 LAB — POCT GLYCOSYLATED HEMOGLOBIN (HGB A1C): Hemoglobin A1C: 8.1 % — AB (ref 4.0–5.6)

## 2023-05-10 LAB — GLUCOSE, CAPILLARY: Glucose-Capillary: 273 mg/dL — ABNORMAL HIGH (ref 70–99)

## 2023-05-10 MED ORDER — DEXCOM G7 SENSOR MISC: 11 refills | Status: DC

## 2023-05-10 MED ORDER — DEXCOM G7 RECEIVER DEVI: 0 refills | Status: AC

## 2023-05-10 MED ORDER — SEMAGLUTIDE (1 MG/DOSE) 4 MG/3ML ~~LOC~~ SOPN: 1.0000 mg | PEN_INJECTOR | SUBCUTANEOUS | 2 refills | Status: DC

## 2023-05-10 NOTE — Progress Notes (Signed)
Patient left prior to being seen today.

## 2023-05-10 NOTE — Assessment & Plan Note (Signed)
Home sleep study positive for moderate OSA. He obtained a CPAP machine recently and has been using it nightly. He reports improved sleep with this and feels more refreshed in the mornings.   -continue CPAP -encourage weight loss

## 2023-05-10 NOTE — Patient Instructions (Signed)
Scott Logan,  It was a pleasure seeing you in the clinic today.   I have increased your ozempic dose to 1mg  every week. Please start taking the new dose this week. I have refilled your DEXCOM G7 supplies. Please use this so that we can obtain more data at your next visit. Please make sure to cut down on salt intake to help with your blood pressure. Also make sure to cut down on sweet intake to help with your diabetes control. Please come back in 1 month for your next visit.  Please call our clinic at [phone] if you have any questions or concerns. The best time to call is Monday-Friday from 9am-4pm, but there is someone available 24/7 at the same number. If you need medication refills, please notify your pharmacy one week in advance and they will send [place] a request.   Thank you for letting us take part in your care. We look forward to seeing you next time!

## 2023-05-10 NOTE — Progress Notes (Signed)
   CC: f/u HTN, T2DM  HPI:  [name] is a 27 y.o. male with history listed below presenting to the [facility] for f/u HTN, T2DM. Please see individualized problem based charting for full HPI.  Past Medical History:  Diagnosis Date   Acute otitis media 10/14/2021   Asthma    Diabetes ([facility])    Hypertension    Necrotizing fasciitis of left buttock ([facility]) 07/04/2020   Admitted on 8/12 for necrotizing fasciitis of perineum extending into base of scrotum, underwent debridement on 8/12, taken back to OR for possible debridement but no necrotic areas wound and just washed out. Wound culture grew MSSA, discharged on 8/17 with PO Keflex for 10 days total, also nystatin for yeast noted around wound.       Review of Systems:  Negative aside from that listed in individualized problem based charting.  Physical Exam:  Vitals:   05/10/23 1433 05/10/23 1513  BP: (!) 147/100 (!) 141/102  Pulse: (!) 105 (!) 104  Temp: 98 F (36.7 C)   TempSrc: Oral   SpO2: 100%   Weight: (!) 348 lb 11.2 oz (158.2 kg)   Height: 5\' 7"  (1.702 m)    Physical Exam Constitutional:      Appearance: Normal appearance. He is obese. He is not ill-appearing.  HENT:     Mouth/Throat:     Mouth: Mucous membranes are moist.     Pharynx: Oropharynx is clear. No oropharyngeal exudate.  Eyes:     General: No scleral icterus.    Extraocular Movements: Extraocular movements intact.     Conjunctiva/sclera: Conjunctivae normal.  Cardiovascular:     Rate and Rhythm: Normal rate and regular rhythm.     Heart sounds: Normal heart sounds. No murmur heard.    No friction rub. No gallop.  Pulmonary:     Effort: Pulmonary effort is normal.     Breath sounds: Normal breath sounds. No wheezing, rhonchi or rales.  Abdominal:     General: Bowel sounds are normal. There is no distension.     Palpations: Abdomen is soft.     Tenderness: There is no abdominal tenderness.  Musculoskeletal:        General: No swelling.  Skin:     General: Skin is warm and dry.  Neurological:     General: No focal deficit present.     Mental Status: He is alert.  Psychiatric:        Mood and Affect: Mood normal.        Behavior: Behavior normal.      Assessment & Plan:   See Encounters Tab for problem based charting.  Patient discussed with Dr. Oswaldo Done

## 2023-05-10 NOTE — Assessment & Plan Note (Signed)
A1c slightly improved from 8.2% to 8.1% today. He is currently on lantus 40u daily, metformin 1g BID, and ozempic 0.5mg  weekly. He endorses dietary indiscretion, has been eating more sweets recently due to personal stressors. He meets with Lupita Leash to discuss nutrition and has a good grasp of what foods to avoid but has been having a hard time following this.   Unable to download [facility] data today and thus hesitant to adjust lantus dosing. He has been on a stable dose of ozempic 0.5mg  weekly for about 2 months now so will increase this to 1mg  weekly today. He will follow up in 1 month. Please download data and see if lantus can be further adjusted. Can also consider further up-titrating ozempic at that time. Unable to use SGLT-2i therapy due to history of necrotizing fasciitis of perineum.   Plan: -continue lantus 40u daily, metformin 1g BID -increase ozempic to 1mg  weekly -refilled DEXCOM sensors and receiver -f/u in 1 month

## 2023-05-10 NOTE — Assessment & Plan Note (Signed)
BP elevated in clinic today, 147/100 with repeat 141/102. He is currently on lisinopril 20mg  daily. We discussed addition of a second agent for more optimal BP control. However, he notes having dietary indiscretion over the past couple of months with increased salt intake. He would like to avoid adding another antihypertensive at this time and would like to bring BP to goal with dietary changes. We discussed DASH diet.   Plan: -continue lisinopril 20mg  daily -f/u in 1 month for BP recheck, if remains above goal would recommend addition of second agent

## 2023-05-11 NOTE — Progress Notes (Signed)
Internal Medicine Clinic Attending  Case discussed with Dr. Jinwala  At the time of the visit.  We reviewed the resident's history and exam and pertinent patient test results.  I agree with the assessment, diagnosis, and plan of care documented in the resident's note.  

## 2023-05-26 NOTE — Progress Notes (Signed)
PATIENT: Scott Logan DOB: 1996-06-02  REASON FOR VISIT: follow up HISTORY FROM: patient PRIMARY NEUROLOGIST: Dr. Frances Furbish  Chief Complaint  Patient presents with   DL done, machine setup 07/22/55   Obstructive Sleep Apnea    Rm 19 alone Pt is well and stable, no concerns with OSA/CPAP. Reports he does sleep and feel better on PAP therapy.       HISTORY OF PRESENT ILLNESS: Today 05/31/23:  Scott Logan is a 27 y.o. male with a history of OSA on CPAP. Returns today for follow-up.  This is his initial compliance download.  He reports that he can tell a difference since he started using the CPAP machine.  He does feel like he sleeps better.  Reports that his blood pressure has been elevated.  Just recently saw his PCP and has a follow-up in a month.  He is not checking his blood pressure at home.  His download is below     HISTORY   REVIEW OF SYSTEMS: Out of a complete 14 system review of symptoms, the patient complains only of the following symptoms, and all other reviewed systems are negative.  FSS ESS  ALLERGIES: No Known Allergies  HOME MEDICATIONS: Outpatient Medications Prior to Visit  Medication Sig Dispense Refill   Accu-Chek Softclix Lancets lancets Check blood sugar up to 3 times daily as directed 100 each 1   blood glucose meter kit and supplies KIT Dispense based on patient and insurance preference. Use up to four times daily as directed. (FOR ICD-9 250.00, 250.01). 1 each 0   Blood Glucose Monitoring Suppl (ACCU-CHEK GUIDE) w/Device KIT 1 each by Does not apply route in the morning, at noon, and at bedtime. 1 kit 1   Continuous Glucose Receiver (DEXCOM G7 RECEIVER) DEVI Use as directed for blood sugar monitoring. 1 each 0   Continuous Glucose Sensor (DEXCOM G7 SENSOR) MISC Use as directed for blood sugar monitoring. 3 each 11   glucose blood (ACCU-CHEK GUIDE) test strip Check blood sugar 3 times per day 100 each 12   insulin glargine (LANTUS SOLOSTAR)  100 UNIT/ML Solostar Pen INJECT 40 UNITS INTO THE SKIN ONCE EVERY DAY 15 mL 2   Insulin Pen Needle 32G X 4 MM MISC USE AS DIRECTED TO INJECT INSULIN FOUR TIMES DAILY. 100 each 3   lisinopril (ZESTRIL) 20 MG tablet Take 1 tablet (20 mg total) by mouth daily. 30 tablet 11   metFORMIN (GLUCOPHAGE) 1000 MG tablet TAKE 1 TABLET BY MOUTH TWICE A DAY WITH A MEAL 60 tablet 11   Semaglutide, 1 MG/DOSE, 4 MG/3ML SOPN Inject 1 mg into the skin once a week. 3 mL 2   No facility-administered medications prior to visit.    PAST MEDICAL HISTORY: Past Medical History:  Diagnosis Date   Acute otitis media 10/14/2021   Asthma    Diabetes (HCC)    Hypertension    Necrotizing fasciitis of left buttock (HCC) 07/04/2020   Admitted on 8/12 for necrotizing fasciitis of perineum extending into base of scrotum, underwent debridement on 8/12, taken back to OR for possible debridement but no necrotic areas wound and just washed out. Wound culture grew MSSA, discharged on 8/17 with PO Keflex for 10 days total, also nystatin for yeast noted around wound.       PAST SURGICAL HISTORY: Past Surgical History:  Procedure Laterality Date   ADENOIDECTOMY     DRESSING CHANGE UNDER ANESTHESIA N/A 07/06/2020   Procedure: DRESSING CHANGE UNDER ANESTHESIA WITH  WASH-OUT OF PERINEUM;  Surgeon: Axel Filler, MD;  Location: [facility] OR;  Service: General;  Laterality: N/A;   INCISION AND DRAINAGE PERIRECTAL ABSCESS N/A 07/04/2020   Procedure: IRRIGATION AND DEBRIDEMENT PERINEAL ABSCESS;  Surgeon: Emelia Loron, MD;  Location: [facility] OR;  Service: General;  Laterality: N/A;    FAMILY HISTORY: Family History  Problem Relation Age of Onset   Kidney Stones Mother    Diabetes Sister    Diabetes Brother        prediabetes    SOCIAL HISTORY: Social History   Socioeconomic History   Marital status: Single    Spouse name: Not on file   Number of children: Not on file   Years of education: Not on file   Highest education  level: Not on file  Occupational History   Occupation: Photographer  Tobacco Use   Smoking status: Never   Smokeless tobacco: Never  Vaping Use   Vaping Use: Never used  Substance and Sexual Activity   Alcohol use: Never   Drug use: Never   Sexual activity: Yes    Birth control/protection: Condom, Coitus interruptus  Other Topics Concern   Not on file  Social History Narrative   Caffiene: occasional soda (zero )   Working: self employed. Videographer.   Social Determinants of Health   Financial Resource Strain: Not on file  Food Insecurity: No Food Insecurity (02/03/2023)   Hunger Vital Sign    Worried About Running Out of Food in the Last Year: Never true    Ran Out of Food in the Last Year: Never true  Transportation Needs: No Transportation Needs (02/03/2023)   PRAPARE - Administrator, Civil Service (Medical): No    Lack of Transportation (Non-Medical): No  Physical Activity: Not on file  Stress: Not on file  Social Connections: Moderately Isolated (02/03/2023)   Social Connection and Isolation Panel [NHANES]    Frequency of Communication with Friends and Family: More than three times a week    Frequency of Social Gatherings with Friends and Family: More than three times a week    Attends Religious Services: Never    Database administrator or Organizations: No    Attends Banker Meetings: Never    Marital Status: Living with partner  Intimate Partner Violence: Not At Risk (02/03/2023)   Humiliation, Afraid, Rape, and Kick questionnaire    Fear of Current or Ex-Partner: No    Emotionally Abused: No    Physically Abused: No    Sexually Abused: No      PHYSICAL EXAM  Vitals:   05/31/23 0846 05/31/23 0852  BP: (!) 163/100 (!) 156/100  Pulse: 87 90  Weight: (!) 349 lb (158.3 kg)   Height: 5\' 7"  (1.702 m)    Body mass index is 54.66 kg/m.  Generalized: Well developed, in no acute distress  Chest: Lungs clear to auscultation  bilaterally  Neurological examination  Mentation: Alert oriented to time, place, history taking. Follows all commands speech and language fluent Cranial nerve II-XII: Extraocular movements were full, visual field were full on confrontational test Head turning and shoulder shrug  were normal and symmetric. Motor: The motor testing reveals 5 over 5 strength of all 4 extremities. Good symmetric motor tone is noted throughout.  Sensory: Sensory testing is intact to soft touch on all 4 extremities. No evidence of extinction is noted.  Gait and station: Gait is normal.    DIAGNOSTIC DATA (LABS, IMAGING, TESTING) - I reviewed patient  records, labs, notes, testing and imaging myself where available.  Lab Results  Component Value Date   WBC 10.1 08/21/2020   HGB 13.3 08/21/2020   HCT 42.0 08/21/2020   MCV 80 08/21/2020   PLT 333 08/21/2020      Component Value Date/Time   NA 136 12/11/2022 1029   K 5.0 12/11/2022 1029   CL 103 12/11/2022 1029   CO2 21 12/11/2022 1029   GLUCOSE 187 (H) 12/11/2022 1029   GLUCOSE 140 (H) 07/09/2020 1237   BUN 12 12/11/2022 1029   CREATININE 0.70 (L) 12/11/2022 1029   CALCIUM 9.0 12/11/2022 1029   PROT 7.9 07/03/2020 1908   ALBUMIN 3.1 (L) 07/03/2020 1908   AST 15 07/03/2020 1908   ALT 26 07/03/2020 1908   ALKPHOS 59 07/03/2020 1908   BILITOT 0.7 07/03/2020 1908   GFRNONAA 121 08/21/2020 1039   GFRAA 140 08/21/2020 1039   Lab Results  Component Value Date   CHOL 178 08/21/2020   HDL 32 (L) 08/21/2020   LDLCALC 112 (H) 08/21/2020   TRIG 195 (H) 08/21/2020   CHOLHDL 5.6 (H) 08/21/2020   Lab Results  Component Value Date   HGBA1C 8.1 (A) 05/10/2023       ASSESSMENT AND PLAN 27 y.o. year old male  has a past medical history of Acute otitis media (10/14/2021), Asthma, Diabetes (HCC), Hypertension, and Necrotizing fasciitis of left buttock (HCC) (07/04/2020). here with:  OSA on CPAP Hypertension   - CPAP compliance excellent - Good  treatment of AHI  - Encourage patient to use CPAP nightly and > 4 hours each night -Blood pressure is elevated.  He just took his medication before coming to this appointment.  Encouraged patient to get a blood pressure machine and recheck it at home.  If it remains elevated he should make his PCP aware. - F/U in 1 year or sooner if needed     Butch Penny, MSN, NP-C 05/31/2023, 8:41 AM [facility] [address] [place], [place] [address] [phone]

## 2023-05-31 ENCOUNTER — Ambulatory Visit: Payer: Medicaid Other | Admitting: Adult Health

## 2023-05-31 ENCOUNTER — Encounter: Payer: Self-pay | Admitting: Adult Health

## 2023-05-31 VITALS — BP 156/100 | HR 90 | Ht 67.0 in | Wt 349.0 lb

## 2023-05-31 DIAGNOSIS — G4733 Obstructive sleep apnea (adult) (pediatric): Secondary | ICD-10-CM

## 2023-05-31 DIAGNOSIS — R030 Elevated blood-pressure reading, without diagnosis of hypertension: Secondary | ICD-10-CM

## 2023-05-31 NOTE — Patient Instructions (Signed)
Continue using CPAP nightly and greater than 4 hours each night °If your symptoms worsen or you develop new symptoms please let us know.  ° °

## 2023-06-15 ENCOUNTER — Other Ambulatory Visit: Payer: Self-pay | Admitting: Internal Medicine

## 2023-06-15 DIAGNOSIS — E119 Type 2 diabetes mellitus without complications: Secondary | ICD-10-CM

## 2023-07-08 ENCOUNTER — Telehealth: Payer: Self-pay

## 2023-07-08 NOTE — Telephone Encounter (Signed)
Prior Authorization for patient (Dexcom G7 Reciever) came through on cover my meds was submitted with last office notes and labs awaiting approval or denial.  ZOX:[id number]

## 2023-07-09 NOTE — Telephone Encounter (Signed)
Decision:Denied [facility] reviewed your [facility] G7 RECEIVER request for the above-identified member,  and it is denied for the following reason: because we did not see what we need to approve the  device you asked for, (Dexcom G7 receiver). We may be able to approve this device when we  see certain records (documentation that you have been using the continuous glucose  monitoring system as prescribed). We based this decision on your health plan's prior  authorization clinical criteria named Therapeutic Continuous Glucose Monitoring Systems  (CGM) and Related Supplies.   Fyi authorization was resubmitted for a 2nd time I sent in supporting documents stating the patient has been using the product. Authorization still denied.

## 2023-08-12 ENCOUNTER — Telehealth: Payer: Self-pay

## 2023-08-12 NOTE — Telephone Encounter (Signed)
Decision:Approved Ashtin Crawmer (Key: [id number]) PA Case ID #: [id number] Rx #: [id number] Need Help? Call us at [phone] Outcome Approved today by [facility] PA Case: [id number], Status: Approved, Coverage Starts on: 08/12/2023 12:00:00 AM, Coverage Ends on: 02/08/2024 12:00:00 AM. Authorization Expiration Date: 02/07/2024 Drug Dexcom G7 Sensor ePA cloud logo Form [facility] [place] [employer] [place] Form [phone] NCPDP) Original Claim Info 73

## 2023-08-12 NOTE — Telephone Encounter (Signed)
Prior Authorization for patient (Dexcom G7 Sensor) came through on cover my meds was submitted with last office notes and labs awaiting approval or denial.  WUJ:[id number]

## 2023-08-17 ENCOUNTER — Other Ambulatory Visit: Payer: Self-pay

## 2023-08-17 DIAGNOSIS — E119 Type 2 diabetes mellitus without complications: Secondary | ICD-10-CM

## 2023-08-17 MED ORDER — SEMAGLUTIDE (1 MG/DOSE) 4 MG/3ML ~~LOC~~ SOPN
1.0000 mg | PEN_INJECTOR | SUBCUTANEOUS | 2 refills | Status: AC
Start: 2023-08-17 — End: ?

## 2023-08-26 ENCOUNTER — Telehealth: Payer: Self-pay

## 2023-08-26 MED ORDER — OZEMPIC (0.25 OR 0.5 MG/DOSE) 2 MG/3ML ~~LOC~~ SOPN: PEN_INJECTOR | SUBCUTANEOUS | 1 refills | Status: AC

## 2023-08-26 NOTE — Telephone Encounter (Signed)
Called pt who stated he wants to be sure it's ok to start back at 1 mg of semaglutide since he has been off this med for couple of months. Or should he start back at a lower dose?

## 2023-08-26 NOTE — Telephone Encounter (Signed)
Patient called regarding rx for semaglutide, patient stated he has been prescribed a higher dose and wants to make sure it is okay for him to take the new rx. Please return patients call.

## 2023-08-26 NOTE — Telephone Encounter (Signed)
Sent in prescription for Ozempic 0.25mg /week x 4 weeks and then 0.5 mg/week x 4 weeks, as patient has been out of ozempic for a couple months and should not restart at the 1 mg dose.

## 2023-08-26 NOTE — Addendum Note (Signed)
Addended by: Elza Rafter on: 08/26/2023 01:15 PM   Modules accepted: Orders

## 2023-08-26 NOTE — Telephone Encounter (Signed)
Called pt - informed Dr Ned Card will start him back on the lower dose.

## 2023-08-31 ENCOUNTER — Ambulatory Visit: Payer: Medicaid Other | Admitting: Internal Medicine

## 2023-08-31 ENCOUNTER — Encounter: Payer: Self-pay | Admitting: Internal Medicine

## 2023-08-31 VITALS — BP 141/91 | HR 93 | Temp 98.2°F | Ht 67.0 in | Wt 354.2 lb

## 2023-08-31 DIAGNOSIS — N181 Chronic kidney disease, stage 1: Secondary | ICD-10-CM | POA: Insufficient documentation

## 2023-08-31 DIAGNOSIS — Z7985 Long-term (current) use of injectable non-insulin antidiabetic drugs: Secondary | ICD-10-CM

## 2023-08-31 DIAGNOSIS — Z794 Long term (current) use of insulin: Secondary | ICD-10-CM | POA: Diagnosis not present

## 2023-08-31 DIAGNOSIS — I129 Hypertensive chronic kidney disease with stage 1 through stage 4 chronic kidney disease, or unspecified chronic kidney disease: Secondary | ICD-10-CM | POA: Diagnosis not present

## 2023-08-31 DIAGNOSIS — E1122 Type 2 diabetes mellitus with diabetic chronic kidney disease: Secondary | ICD-10-CM

## 2023-08-31 DIAGNOSIS — I10 Essential (primary) hypertension: Secondary | ICD-10-CM

## 2023-08-31 DIAGNOSIS — Z8739 Personal history of other diseases of the musculoskeletal system and connective tissue: Secondary | ICD-10-CM

## 2023-08-31 DIAGNOSIS — E1121 Type 2 diabetes mellitus with diabetic nephropathy: Secondary | ICD-10-CM

## 2023-08-31 DIAGNOSIS — E11319 Type 2 diabetes mellitus with unspecified diabetic retinopathy without macular edema: Secondary | ICD-10-CM | POA: Insufficient documentation

## 2023-08-31 DIAGNOSIS — Z0000 Encounter for general adult medical examination without abnormal findings: Secondary | ICD-10-CM

## 2023-08-31 DIAGNOSIS — E119 Type 2 diabetes mellitus without complications: Secondary | ICD-10-CM

## 2023-08-31 HISTORY — DX: Personal history of other diseases of the musculoskeletal system and connective tissue: Z87.39

## 2023-08-31 LAB — POCT GLYCOSYLATED HEMOGLOBIN (HGB A1C): Hemoglobin A1C: 9 % — AB (ref 4.0–5.6)

## 2023-08-31 LAB — GLUCOSE, CAPILLARY: Glucose-Capillary: 187 mg/dL — ABNORMAL HIGH (ref 70–99)

## 2023-08-31 MED ORDER — GLIPIZIDE 5 MG PO TABS
2.5000 mg | ORAL_TABLET | Freq: Every day | ORAL | 1 refills | Status: DC
Start: 2023-08-31 — End: 2024-06-21

## 2023-08-31 NOTE — Assessment & Plan Note (Signed)
Urine micro >2400 over one year ago. Rechecking today, as he may need a referral to nephrology. Cannot be on an sglt2i given his history of necrotizing fasciitis.

## 2023-08-31 NOTE — Assessment & Plan Note (Signed)
Blood pressure elevated to 141/91 today. He is currently on lisinopril 20 mg daily. The patient believes his elevated BP is in the setting of his dietary indiscretion and he would like to work on decreasing his salt intake. This was also discussed at his last office visit and he was suggested to start a second antihypertensive agent, however, he declined. I again brought up this suggestion but the patient would prefer to work on lifestyle modifications to bring down his BP. Additionally, since we are starting a new medicine for his diabetes today he would not like to start another antihypertensive.

## 2023-08-31 NOTE — Assessment & Plan Note (Signed)
Urine micro/creatinine ratio elevated to >2400 one year ago. Rechecking today.

## 2023-08-31 NOTE — Assessment & Plan Note (Addendum)
Diabetes is not well controlled - A1c increased from 8.1% to 9.0% today. We previously tried to increase his ozempic at the last office visit but his insurance did not cover it at first and by the time they approved it, he had been out of ozempic for about 1 month. He has since restarted it at the low dose and we are re-titrating it up. The patient does not check his blood sugars regularly despite being on insulin and he also has not used his Dexcom. I stressed the importance of checking his blood sugar while on insulin, so we can properly titrate his medicines. Although he does not check his sugar everyday, he denies any symptoms of hypoglycemia.  Plan: - Continue lantus 40u at bedtime, metformin 1 g bid - Continue restarting ozempic: 0.25 mg/week x 4 weeks and then 0.5 mg/week - START low dose glipizide 2.5 mg daily with breakfast (do not overlap with insulin and ensure he takes it with meals) - goal to ultimately get off of this medicine but using it while bridging his ozempic; risk of hypoglycemia will be minimized as the patient will take it with food and will not take at same time as insulin - Urine micro today - Foot exam at next office visit (patient deferred) - Follow up with 6 weeks with blood glucose log for close follow up

## 2023-08-31 NOTE — Patient Instructions (Signed)
Thank you, Scott Logan for allowing [place] to provide your care today. Today we discussed:  Diabetes: Keep taking metformin 1 g twice a day Keep taking lantus 40 units each night Keep taking Ozempic 0.25 mg/week and after 4 weeks go up to 0.5 mg/week START glipizide 2.5 mg in the morning -- take this with a meal!!  Please start checking your blood sugars more regularly or use your Dexcom - you can always meet with Lupita Leash and she can help you put it on if needed High blood pressure Keep taking lisinopril 20 mg daily  I have ordered the following labs for you:   Lab Orders         Glucose, capillary         Microalbumin / Creatinine Urine Ratio         POC Hbg A1C       Referrals ordered today:   Referral Orders  No referral(s) requested today     I have ordered the following medication/changed the following medications:   Stop the following medications: There are no discontinued medications.   Start the following medications: Meds ordered this encounter  Medications   glipiZIDE (GLUCOTROL) 5 MG tablet    Sig: Take 0.5 tablets (2.5 mg total) by mouth daily with breakfast.    Dispense:  30 tablet    Refill:  1     Follow up:  ~6 weeks     Should you have any questions or concerns please call the internal medicine clinic at [phone].     Elza Rafter, D.O. [facility]

## 2023-08-31 NOTE — Assessment & Plan Note (Signed)
Declined flu vaccine

## 2023-08-31 NOTE — Assessment & Plan Note (Signed)
Patient has evidence of diabetic retinopathy as seen on eye exam in February 2024.

## 2023-08-31 NOTE — Progress Notes (Signed)
CC: diabetes  HPI:  Scott Logan is a 27 y.o. male living with a history stated below and presents today for a follow up of his diabetes. Please see problem based assessment and plan for additional details.  Past Medical History:  Diagnosis Date   Acute otitis media 10/14/2021   Asthma    Diabetes (HCC)    Hypertension    Necrotizing fasciitis of left buttock (HCC) 07/04/2020   Admitted on 8/12 for necrotizing fasciitis of perineum extending into base of scrotum, underwent debridement on 8/12, taken back to OR for possible debridement but no necrotic areas wound and just washed out. Wound culture grew MSSA, discharged on 8/17 with PO Keflex for 10 days total, also nystatin for yeast noted around wound.      Rash and nonspecific skin eruption 06/02/2022    Current Outpatient Medications on File Prior to Visit  Medication Sig Dispense Refill   Accu-Chek Softclix Lancets lancets Check blood sugar up to 3 times daily as directed 100 each 1   blood glucose meter kit and supplies KIT Dispense based on patient and insurance preference. Use up to four times daily as directed. (FOR ICD-9 250.00, 250.01). 1 each 0   Blood Glucose Monitoring Suppl (ACCU-CHEK GUIDE) w/Device KIT 1 each by Does not apply route in the morning, at noon, and at bedtime. 1 kit 1   Continuous Glucose Receiver (DEXCOM G7 RECEIVER) DEVI Use as directed for blood sugar monitoring. 1 each 0   Continuous Glucose Sensor (DEXCOM G7 SENSOR) MISC Use as directed for blood sugar monitoring. 3 each 11   glucose blood (ACCU-CHEK GUIDE) test strip Check blood sugar 3 times per day 100 each 12   insulin glargine-yfgn (SEMGLEE, YFGN,) 100 UNIT/ML Pen INJECT 40 UNITS INTO THE SKIN ONCE EVERY DAY 15 mL 1   Insulin Pen Needle 32G X 4 MM MISC USE AS DIRECTED TO INJECT INSULIN FOUR TIMES DAILY. 100 each 3   lisinopril (ZESTRIL) 20 MG tablet Take 1 tablet (20 mg total) by mouth daily. 30 tablet 11   metFORMIN (GLUCOPHAGE) 1000 MG  tablet TAKE 1 TABLET BY MOUTH TWICE A DAY WITH A MEAL 60 tablet 11   OZEMPIC, 0.25 OR 0.5 MG/DOSE, 2 MG/3ML SOPN Inject 0.25 mg into the skin once a week for 28 days, THEN 0.5 mg once a week for 28 days. 3 mL 1   No current facility-administered medications on file prior to visit.    Family History  Problem Relation Age of Onset   Kidney Stones Mother    Diabetes Sister    Diabetes Brother        prediabetes    Social History   Socioeconomic History   Marital status: Single    Spouse name: Not on file   Number of children: Not on file   Years of education: Not on file   Highest education level: Not on file  Occupational History   Occupation: Photographer  Tobacco Use   Smoking status: Never   Smokeless tobacco: Never  Vaping Use   Vaping status: Never Used  Substance and Sexual Activity   Alcohol use: Never   Drug use: Never   Sexual activity: Yes    Birth control/protection: Condom, Coitus interruptus  Other Topics Concern   Not on file  Social History Narrative   Caffiene: occasional soda (zero )   Working: self employed. Videographer.   Social Determinants of Health   Financial Resource Strain: Not on file  Food  Insecurity: No Food Insecurity (02/03/2023)   Hunger Vital Sign    Worried About Running Out of Food in the Last Year: Never true    Ran Out of Food in the Last Year: Never true  Transportation Needs: No Transportation Needs (02/03/2023)   PRAPARE - Administrator, Civil Service (Medical): No    Lack of Transportation (Non-Medical): No  Physical Activity: Not on file  Stress: Not on file  Social Connections: Moderately Isolated (02/03/2023)   Social Connection and Isolation Panel [NHANES]    Frequency of Communication with Friends and Family: More than three times a week    Frequency of Social Gatherings with Friends and Family: More than three times a week    Attends Religious Services: Never    Database administrator or Organizations: No     Attends Banker Meetings: Never    Marital Status: Living with partner  Intimate Partner Violence: Not At Risk (02/03/2023)   Humiliation, Afraid, Rape, and Kick questionnaire    Fear of Current or Ex-Partner: No    Emotionally Abused: No    Physically Abused: No    Sexually Abused: No    Review of Systems: ROS negative except for what is noted on the assessment and plan.  Vitals:   08/31/23 1540 08/31/23 1622  BP: (!) 148/93 (!) 141/91  Pulse: 98 93  Temp: 98.2 F (36.8 C)   TempSrc: Oral   SpO2: 98%   Weight: (!) 354 lb 3.2 oz (160.7 kg)   Height: 5\' 7"  (1.702 m)     Physical Exam: Constitutional: well-appearing obese male sitting in chair, in no acute distress Cardiovascular: regular rate and rhythm, no m/r/g Pulmonary/Chest: normal work of breathing on room air, lungs clear to auscultation bilaterally MSK: normal bulk and tone Neurological: alert & oriented x 3, no focal deficit Skin: warm and dry Psych: normal mood and behavior  Assessment & Plan:    Patient discussed with Dr. 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 We previously tried to increase his ozempic at the last office visit but his insurance did not cover it at first and by the time they approved it, he had been out of ozempic for about 1 month. He has since restarted it at the low dose and we are re-titrating it up. The patient does not check his blood sugars regularly despite being on insulin and he also has not used his Dexcom. I stressed the importance of checking his blood sugar while on insulin, so we can properly titrate his medicines. Although he does not check his sugar everyday, he denies any symptoms of hypoglycemia.  Plan: - Continue lantus 40u at bedtime, metformin 1 g bid - Continue restarting ozempic: 0.25 mg/week x 4 weeks and then 0.5 mg/week - START low dose glipizide 2.5 mg  daily with breakfast (do not overlap with insulin and ensure he takes it with meals) - goal to ultimately get off of this medicine but using it while bridging his ozempic; risk of hypoglycemia will be minimized as the patient will take it with food and will not take at same time as insulin - Urine micro today - Foot exam at next office visit (patient deferred) - Follow up with 6 weeks with blood glucose log for close follow up  Diabetic retinopathy [facility]) Patient has evidence of diabetic retinopathy as seen on eye exam in February  2024.  Diabetic nephropathy (HCC) Urine micro/creatinine ratio elevated to >2400 one year ago. Rechecking today.   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 Rechecking today, as he may need a referral to nephrology. Cannot be on an sglt2i given his history of necrotizing fasciitis.   Healthcare maintenance Declined flu vaccine  Primary Hypertension Blood pressure elevated to 141/91 today. He is currently on lisinopril 20 mg daily. The patient believes his elevated BP is in the setting of his dietary indiscretion and he would like to work on decreasing his salt intake. This was also discussed at his last office visit and he was suggested to start a second antihypertensive agent, however, he declined. I again brought up this suggestion but the patient would prefer to work on lifestyle modifications to bring down his BP. Additionally, since we are starting a new medicine for his diabetes today he would not like to start another antihypertensive.   Elza Rafter, D.O. [facility], PGY-3 Phone: [phone] Date 08/31/2023 Time 4:44 PM

## 2023-09-02 LAB — MICROALBUMIN / CREATININE URINE RATIO
Creatinine, Urine: 141.5 mg/dL
Microalb/Creat Ratio: 3544 mg/g{creat} — ABNORMAL HIGH (ref 0–29)
Microalbumin, Urine: 5014.9 ug/mL

## 2023-09-02 MED ORDER — BLOOD GLUCOSE MONITOR KIT: PACK | 0 refills | Status: AC

## 2023-09-02 MED ORDER — BLOOD GLUCOSE MONITOR KIT: PACK | 0 refills | Status: DC

## 2023-09-02 NOTE — Addendum Note (Signed)
Addended by: Elza Rafter on: 09/02/2023 03:51 PM   Modules accepted: Orders

## 2023-09-02 NOTE — Progress Notes (Signed)
Called patient regarding significantly elevated urine micro/creatinine ratio. He has CKD A3 based on his proteinuria. Will have patient come back in 1 week so we can switch patient to an ARB (from his ACEi) and also discuss starting verapamil (if his BP allows) for his diabetic nephropathy. Cannot use SGLT2i given his history of necrotizing fasciitis.

## 2023-09-02 NOTE — Addendum Note (Signed)
Addended by: Elza Rafter on: 09/02/2023 02:19 PM   Modules accepted: Orders

## 2023-09-09 ENCOUNTER — Ambulatory Visit: Payer: Medicaid Other | Admitting: Internal Medicine

## 2023-09-09 ENCOUNTER — Encounter: Payer: Self-pay | Admitting: Internal Medicine

## 2023-09-09 VITALS — BP 167/73 | HR 87 | Temp 98.7°F | Ht 67.0 in | Wt 357.6 lb

## 2023-09-09 DIAGNOSIS — E1121 Type 2 diabetes mellitus with diabetic nephropathy: Secondary | ICD-10-CM | POA: Diagnosis present

## 2023-09-09 DIAGNOSIS — Z7984 Long term (current) use of oral hypoglycemic drugs: Secondary | ICD-10-CM

## 2023-09-09 DIAGNOSIS — I10 Essential (primary) hypertension: Secondary | ICD-10-CM | POA: Diagnosis not present

## 2023-09-09 MED ORDER — LISINOPRIL 40 MG PO TABS
40.0000 mg | ORAL_TABLET | Freq: Every day | ORAL | 3 refills | Status: DC
Start: 2023-09-09 — End: 2024-03-14

## 2023-09-09 MED ORDER — AMLODIPINE BESYLATE 5 MG PO TABS: 5.0000 mg | ORAL_TABLET | Freq: Every day | ORAL | 11 refills | Status: DC

## 2023-09-09 NOTE — Progress Notes (Signed)
CC: microalbuminuria  HPI:  [name] is a 27 y.o. male living with a history stated below and presents today for a follow up of his microalbuminuria and hypertension. Please see problem based assessment and plan for additional details.  Past Medical History:  Diagnosis Date   Acute otitis media 10/14/2021   Asthma    Diabetes (HCC)    Hypertension    Necrotizing fasciitis of left buttock (HCC) 07/04/2020   Admitted on 8/12 for necrotizing fasciitis of perineum extending into base of scrotum, underwent debridement on 8/12, taken back to OR for possible debridement but no necrotic areas wound and just washed out. Wound culture grew MSSA, discharged on 8/17 with PO Keflex for 10 days total, also nystatin for yeast noted around wound.      Rash and nonspecific skin eruption 06/02/2022    Current Outpatient Medications on File Prior to Visit  Medication Sig Dispense Refill   Accu-Chek Softclix Lancets lancets Check blood sugar up to 3 times daily as directed 100 each 1   blood glucose meter kit and supplies KIT Dispense based on patient and insurance preference. Use up to four times daily as directed. (FOR ICD-9 250.00, 250.01). 1 each 0   Blood Glucose Monitoring Suppl (ACCU-CHEK GUIDE) w/Device KIT 1 each by Does not apply route in the morning, at noon, and at bedtime. 1 kit 1   Continuous Glucose Receiver (DEXCOM G7 RECEIVER) DEVI Use as directed for blood sugar monitoring. 1 each 0   Continuous Glucose Sensor (DEXCOM G7 SENSOR) MISC Use as directed for blood sugar monitoring. 3 each 11   glipiZIDE (GLUCOTROL) 5 MG tablet Take 0.5 tablets (2.5 mg total) by mouth daily with breakfast. 30 tablet 1   glucose blood (ACCU-CHEK GUIDE) test strip Check blood sugar 3 times per day 100 each 12   insulin glargine-yfgn (SEMGLEE, YFGN,) 100 UNIT/ML Pen INJECT 40 UNITS INTO THE SKIN ONCE EVERY DAY 15 mL 1   Insulin Pen Needle 32G X 4 MM MISC USE AS DIRECTED TO INJECT INSULIN FOUR TIMES  DAILY. 100 each 3   metFORMIN (GLUCOPHAGE) 1000 MG tablet TAKE 1 TABLET BY MOUTH TWICE A DAY WITH A MEAL 60 tablet 11   OZEMPIC, 0.25 OR 0.5 MG/DOSE, 2 MG/3ML SOPN Inject 0.25 mg into the skin once a week for 28 days, THEN 0.5 mg once a week for 28 days. 3 mL 1   No current facility-administered medications on file prior to visit.    Family History  Problem Relation Age of Onset   Kidney Stones Mother    Diabetes Sister    Diabetes Brother        prediabetes    Social History   Socioeconomic History   Marital status: Single    Spouse name: Not on file   Number of children: Not on file   Years of education: Not on file   Highest education level: Not on file  Occupational History   Occupation: Photographer  Tobacco Use   Smoking status: Never   Smokeless tobacco: Never  Vaping Use   Vaping status: Never Used  Substance and Sexual Activity   Alcohol use: Never   Drug use: Never   Sexual activity: Yes    Birth control/protection: Condom, Coitus interruptus  Other Topics Concern   Not on file  Social History Narrative   Caffiene: occasional soda (zero )   Working: self employed. Videographer.   Social Determinants of Health   Financial Resource Strain: Not  on file  Food Insecurity: No Food Insecurity (02/03/2023)   Hunger Vital Sign    Worried About Running Out of Food in the Last Year: Never true    Ran Out of Food in the Last Year: Never true  Transportation Needs: No Transportation Needs (02/03/2023)   PRAPARE - Administrator, Civil Service (Medical): No    Lack of Transportation (Non-Medical): No  Physical Activity: Not on file  Stress: Not on file  Social Connections: Moderately Isolated (02/03/2023)   Social Connection and Isolation Panel [NHANES]    Frequency of Communication with Friends and Family: More than three times a week    Frequency of Social Gatherings with Friends and Family: More than three times a week    Attends Religious Services:  Never    Database administrator or Organizations: No    Attends Banker Meetings: Never    Marital Status: Living with partner  Intimate Partner Violence: Not At Risk (02/03/2023)   Humiliation, Afraid, Rape, and Kick questionnaire    Fear of Current or Ex-Partner: No    Emotionally Abused: No    Physically Abused: No    Sexually Abused: No    Review of Systems: ROS negative except for what is noted on the assessment and plan.  Vitals:   09/09/23 1128 09/09/23 1140  BP: (!) 167/84 (!) 167/73  Pulse: 93 87  Temp: 98.7 F (37.1 C)   TempSrc: Oral   SpO2: 98%   Weight: (!) 357 lb 9.6 oz (162.2 kg)   Height: 5\' 7"  (1.702 m)     Physical Exam: Constitutional: well-appearing, obese young male sitting in chair, in no acute distress Cardiovascular: regular rate and rhythm, no m/r/g Pulmonary/Chest: normal work of breathing on room air MSK: normal bulk and tone Neurological: alert & oriented x 3, no focal deficit Skin: warm and dry Psych: normal mood and behavior  Assessment & Plan:   Patient discussed with Dr. Oswaldo Done  Diabetic nephropathy (HCC) Urine microalbumin elevated to >3500, consistent with CKD G1A3. In addition to this, the patient's blood pressure is poorly controlled. He is already on an ACEi and cannot be on an SGLT2i given his history of necrotizing fasciitis of his perineum.   Plan: - Referral to nephrology (per KDIGO guidelines) - Increase lisinopril to 40 mg daily - BMP today (would check q6 months given his severe proteinuria)  Primary Hypertension BP remains uncontrolled at 167/84 and then 167/83 upon recheck. While the patient was hesitant to start an additional agent for his hypertension at his last two office visits, I stressed the importance of good BP control today given his severe proteinuria. The patient understands and is agreeable to start an additional med today.  Plan: - Increase lisinopril to 40 mg daily - Start amlodipine 5 mg  daily - Follow up in ~4 weeks   Berdia Lachman, D.O. [facility] Medicine, PGY-3 Phone: [phone] Date 09/09/2023 Time 1:36 PM

## 2023-09-09 NOTE — Assessment & Plan Note (Addendum)
Urine microalbumin elevated to >3500, consistent with CKD G1A3. In addition to this, the patient's blood pressure is poorly controlled. He is already on an ACEi and cannot be on an SGLT2i given his history of necrotizing fasciitis of his perineum.   Plan: - Referral to nephrology (per KDIGO guidelines) - Increase lisinopril to 40 mg daily - BMP today (would check q6 months given his severe proteinuria)

## 2023-09-09 NOTE — Assessment & Plan Note (Signed)
BP remains uncontrolled at 167/84 and then 167/83 upon recheck. While the patient was hesitant to start an additional agent for his hypertension at his last two office visits, I stressed the importance of good BP control today given his severe proteinuria. The patient understands and is agreeable to start an additional med today.  Plan: - Increase lisinopril to 40 mg daily - Start amlodipine 5 mg daily - Follow up in ~4 weeks

## 2023-09-09 NOTE — Patient Instructions (Signed)
Thank you, Mr.Gatlin U Takacs for allowing [place] to provide your care today. Today we discussed:  High blood pressure/protein in urine Increase lisinopril to 40 mg daily (2 tablets of the 20 mg until you run out and then the higher prescription will be at your pharmacy) Start amlodipine 5 mg daily Come back in ~4 weeks to recheck blood pressure  I have ordered the following labs for you:   Lab Orders         BMP8+Anion Gap        Referrals ordered today:    Referral Orders         Ambulatory referral to Nephrology      I have ordered the following medication/changed the following medications:   Stop the following medications: Medications Discontinued During This Encounter  Medication Reason   lisinopril (ZESTRIL) 20 MG tablet Reorder     Start the following medications: Meds ordered this encounter  Medications   lisinopril (ZESTRIL) 40 MG tablet    Sig: Take 1 tablet (40 mg total) by mouth daily.    Dispense:  30 tablet    Refill:  3    IM program per dr white 12/21 cma   amLODipine (NORVASC) 5 MG tablet    Sig: Take 1 tablet (5 mg total) by mouth daily.    Dispense:  30 tablet    Refill:  11     Follow up:  About 1 month     Should you have any questions or concerns please call the internal medicine clinic at [phone].     Elza Rafter, D.O. [facility]

## 2023-09-10 NOTE — Progress Notes (Signed)
Internal Medicine Clinic Attending  Case discussed with the resident at the time of the visit.  We reviewed the resident's history and exam and pertinent patient test results.  I agree with the assessment, diagnosis, and plan of care documented in the resident's note.  

## 2023-09-10 NOTE — Progress Notes (Signed)
 Internal Medicine Clinic Attending  Case discussed with the resident physician at the time of the visit.  We reviewed the patient's history, exam, and pertinent patient test results.  I agree with the assessment, diagnosis, and plan of care documented in the resident's note.

## 2023-09-11 LAB — BMP8+ANION GAP
Anion Gap: 13 mmol/L (ref 10.0–18.0)
BUN/Creatinine Ratio: 19 (ref 9–20)
BUN: 17 mg/dL (ref 6–20)
CO2: 22 mmol/L (ref 20–29)
Calcium: 9.2 mg/dL (ref 8.7–10.2)
Chloride: 102 mmol/L (ref 96–106)
Creatinine, Ser: 0.89 mg/dL (ref 0.76–1.27)
Glucose: 271 mg/dL — ABNORMAL HIGH (ref 70–99)
Potassium: 5.1 mmol/L (ref 3.5–5.2)
Sodium: 137 mmol/L (ref 134–144)
eGFR: 120 mL/min/{1.73_m2} (ref >59)

## 2023-09-13 ENCOUNTER — Encounter: Payer: Self-pay | Admitting: Internal Medicine

## 2023-09-13 NOTE — Progress Notes (Signed)
GFR still preserved at 120. No changes to plan at this time - patient still should see nephrology and needs better BP control. Sent MyChart message.

## 2023-09-16 ENCOUNTER — Other Ambulatory Visit: Payer: Self-pay | Admitting: Internal Medicine

## 2023-09-16 DIAGNOSIS — E119 Type 2 diabetes mellitus without complications: Secondary | ICD-10-CM

## 2023-10-07 ENCOUNTER — Other Ambulatory Visit: Payer: Self-pay | Admitting: Nephrology

## 2023-10-07 DIAGNOSIS — N181 Chronic kidney disease, stage 1: Secondary | ICD-10-CM

## 2023-10-12 LAB — LAB REPORT - SCANNED
Creatinine, POC: 218.5 mg/dL
EGFR: 120

## 2023-10-13 ENCOUNTER — Other Ambulatory Visit: Payer: Medicaid Other

## 2023-10-20 ENCOUNTER — Other Ambulatory Visit: Payer: Self-pay | Admitting: Internal Medicine

## 2023-10-20 DIAGNOSIS — E119 Type 2 diabetes mellitus without complications: Secondary | ICD-10-CM

## 2023-10-25 ENCOUNTER — Ambulatory Visit
Admission: RE | Admit: 2023-10-25 | Discharge: 2023-10-25 | Disposition: A | Discharge status: Home / Self Care | Payer: Medicaid Other | Source: Ambulatory Visit | Attending: Nephrology | Admitting: Nephrology

## 2023-10-25 DIAGNOSIS — N181 Chronic kidney disease, stage 1: Secondary | ICD-10-CM

## 2023-11-08 ENCOUNTER — Other Ambulatory Visit: Payer: Self-pay | Admitting: Internal Medicine

## 2023-11-08 DIAGNOSIS — E119 Type 2 diabetes mellitus without complications: Secondary | ICD-10-CM

## 2023-11-08 NOTE — Telephone Encounter (Signed)
Next appt scheduled 12/23/with PCP.

## 2023-11-10 NOTE — Telephone Encounter (Signed)
I called pt - no answer; left message to call the office , "need to clarify" one of his medication.

## 2023-11-12 ENCOUNTER — Other Ambulatory Visit: Payer: Self-pay | Admitting: Internal Medicine

## 2023-11-15 ENCOUNTER — Telehealth: Payer: Self-pay

## 2023-11-15 ENCOUNTER — Encounter: Payer: Medicaid Other | Admitting: Internal Medicine

## 2023-11-15 NOTE — Telephone Encounter (Signed)
I called and spoke with the patient he stated he has been taking the 0.5mg  dose.

## 2023-11-15 NOTE — Progress Notes (Deleted)
CC: ***  HPI:  [name] is a 27 y.o. male living with a history stated below and presents today for ***. Please see problem based assessment and plan for additional details.  Past Medical History:  Diagnosis Date   Acute otitis media 10/14/2021   Asthma    Diabetes (HCC)    Hypertension    Necrotizing fasciitis of left buttock (HCC) 07/04/2020   Admitted on 8/12 for necrotizing fasciitis of perineum extending into base of scrotum, underwent debridement on 8/12, taken back to OR for possible debridement but no necrotic areas wound and just washed out. Wound culture grew MSSA, discharged on 8/17 with PO Keflex for 10 days total, also nystatin for yeast noted around wound.      Rash and nonspecific skin eruption 06/02/2022    Current Outpatient Medications on File Prior to Visit  Medication Sig Dispense Refill   Accu-Chek Softclix Lancets lancets Check blood sugar up to 3 times daily as directed 100 each 1   amLODipine (NORVASC) 5 MG tablet Take 1 tablet (5 mg total) by mouth daily. 30 tablet 11   blood glucose meter kit and supplies KIT Dispense based on patient and insurance preference. Use up to four times daily as directed. (FOR ICD-9 250.00, 250.01). 1 each 0   Blood Glucose Monitoring Suppl (ACCU-CHEK GUIDE) w/Device KIT 1 each by Does not apply route in the morning, at noon, and at bedtime. 1 kit 1   Continuous Glucose Receiver (DEXCOM G7 RECEIVER) DEVI Use as directed for blood sugar monitoring. 1 each 0   Continuous Glucose Sensor (DEXCOM G7 SENSOR) MISC Use as directed for blood sugar monitoring. 3 each 11   glipiZIDE (GLUCOTROL) 5 MG tablet Take 0.5 tablets (2.5 mg total) by mouth daily with breakfast. 30 tablet 1   glucose blood (ACCU-CHEK GUIDE) test strip Check blood sugar 3 times per day 100 each 12   insulin glargine-yfgn (SEMGLEE, YFGN,) 100 UNIT/ML Pen INJECT 40 UNITS INTO THE SKIN ONCE EVERY DAY 15 mL 3   Insulin Pen Needle 32G X 4 MM MISC USE AS DIRECTED  TO INJECT INSULIN FOUR TIMES DAILY. 100 each 3   lisinopril (ZESTRIL) 40 MG tablet Take 1 tablet (40 mg total) by mouth daily. 30 tablet 3   metFORMIN (GLUCOPHAGE) 1000 MG tablet TAKE 1 TABLET BY MOUTH TWICE A DAY WITH A MEAL 60 tablet 11   No current facility-administered medications on file prior to visit.    Family History  Problem Relation Age of Onset   Kidney Stones Mother    Diabetes Sister    Diabetes Brother        prediabetes    Social History   Socioeconomic History   Marital status: Single    Spouse name: Not on file   Number of children: Not on file   Years of education: Not on file   Highest education level: Not on file  Occupational History   Occupation: Photographer  Tobacco Use   Smoking status: Never   Smokeless tobacco: Never  Vaping Use   Vaping status: Never Used  Substance and Sexual Activity   Alcohol use: Never   Drug use: Never   Sexual activity: Yes    Birth control/protection: Condom, Coitus interruptus  Other Topics Concern   Not on file  Social History Narrative   Caffiene: occasional soda (zero )   Working: self employed. Videographer.   Social Drivers of Corporate investment banker Strain: Not on file  Food Insecurity: No Food Insecurity (02/03/2023)   Hunger Vital Sign    Worried About Running Out of Food in the Last Year: Never true    Ran Out of Food in the Last Year: Never true  Transportation Needs: No Transportation Needs (02/03/2023)   PRAPARE - Administrator, Civil Service (Medical): No    Lack of Transportation (Non-Medical): No  Physical Activity: Not on file  Stress: Not on file  Social Connections: Moderately Isolated (02/03/2023)   Social Connection and Isolation Panel [NHANES]    Frequency of Communication with Friends and Family: More than three times a week    Frequency of Social Gatherings with Friends and Family: More than three times a week    Attends Religious Services: Never    Doctor, general practice or Organizations: No    Attends Banker Meetings: Never    Marital Status: Living with partner  Intimate Partner Violence: Not At Risk (02/03/2023)   Humiliation, Afraid, Rape, and Kick questionnaire    Fear of Current or Ex-Partner: No    Emotionally Abused: No    Physically Abused: No    Sexually Abused: No    Review of Systems: ROS negative except for what is noted on the assessment and plan.  There were no vitals filed for this visit.  Physical Exam: Constitutional: well-appearing *** sitting in ***, in no acute distress HENT: normocephalic atraumatic, mucous membranes moist Eyes: conjunctiva non-erythematous Cardiovascular: regular rate and rhythm, no m/r/g Pulmonary/Chest: normal work of breathing on room air, lungs clear to auscultation bilaterally Abdominal: soft, non-tender, non-distended MSK: normal bulk and tone Neurological: alert & oriented x 3, no focal deficit Skin: warm and dry Psych: normal mood and behavior  Assessment & Plan:   DM: A1c 9% > - lantus 40u at bedtime - metformin 1 g bid - ozempic  - glipizide 2.5 mg daily w meals, not w insulin  HTN: - lisinopril 40 - amldoipine 5 - BMP  Patient {GC/GE:[id number]::"discussed with","seen with"} Dr. {EAVWU:[id number]::"NWGNFAOZ","H. Hoffman","Mullen","Narendra","Vincent","Guilloud","Lau","Machen"}  No problem-specific Assessment & Plan notes found for this encounter.   Elza Rafter, D.O. [facility], PGY-3 Phone: [phone] Date 11/15/2023 Time 7:28 AM

## 2023-11-15 NOTE — Telephone Encounter (Signed)
Prior Authorization for patient (Ozempic (1 MG/DOSE) 4MG /3ML pen-injectors) came through on cover my meds was submitted per cover my meds..  Information regarding your request Available without authorization.

## 2023-12-07 ENCOUNTER — Ambulatory Visit: Payer: Medicaid Other | Admitting: Student

## 2023-12-07 ENCOUNTER — Encounter: Payer: Self-pay | Admitting: Student

## 2023-12-07 VITALS — BP 133/86 | HR 97 | Ht 67.0 in | Wt 354.8 lb

## 2023-12-07 DIAGNOSIS — E119 Type 2 diabetes mellitus without complications: Secondary | ICD-10-CM

## 2023-12-07 DIAGNOSIS — Z7984 Long term (current) use of oral hypoglycemic drugs: Secondary | ICD-10-CM | POA: Diagnosis not present

## 2023-12-07 DIAGNOSIS — I10 Essential (primary) hypertension: Secondary | ICD-10-CM

## 2023-12-07 DIAGNOSIS — Z7985 Long-term (current) use of injectable non-insulin antidiabetic drugs: Secondary | ICD-10-CM | POA: Diagnosis not present

## 2023-12-07 LAB — POCT GLYCOSYLATED HEMOGLOBIN (HGB A1C): Hemoglobin A1C: 9 % — AB (ref 4.0–5.6)

## 2023-12-07 LAB — GLUCOSE, CAPILLARY: Glucose-Capillary: 137 mg/dL — ABNORMAL HIGH (ref 70–99)

## 2023-12-07 NOTE — Progress Notes (Addendum)
 CC: Diabetes and hypertension follow-up  HPI:  [name] is a 28 y.o. male with a past medical history of hypertension, OSA, type 2 diabetes, CKD who presents for follow-up appointment.  Please see assessment and plan for full HPI.  Medications: Diabetes: Ozempic  1 mg weekly, metformin  1000 mg twice daily, Semglee  40 units daily Hypertension: Amlodipine  5 mg daily, lisinopril  40 mg daily  Past Medical History:  Diagnosis Date   Acute otitis media 10/14/2021   Asthma    Diabetes (HCC)    Hypertension    Necrotizing fasciitis of left buttock (HCC) 07/04/2020   Admitted on 8/12 for necrotizing fasciitis of perineum extending into base of scrotum, underwent debridement on 8/12, taken back to OR for possible debridement but no necrotic areas wound and just washed out. Wound culture grew MSSA, discharged on 8/17 with PO Keflex  for 10 days total, also nystatin  for yeast noted around wound.      Rash and nonspecific skin eruption 06/02/2022     Current Outpatient Medications:    Accu-Chek Softclix Lancets lancets, Check blood sugar up to 3 times daily as directed, Disp: 100 each, Rfl: 1   amLODipine  (NORVASC ) 5 MG tablet, Take 1 tablet (5 mg total) by mouth daily., Disp: 30 tablet, Rfl: 11   blood glucose meter kit and supplies KIT, Dispense based on patient and insurance preference. Use up to four times daily as directed. (FOR ICD-9 250.00, 250.01)., Disp: 1 each, Rfl: 0   Blood Glucose Monitoring Suppl (ACCU-CHEK GUIDE) w/Device KIT, 1 each by Does not apply route in the morning, at noon, and at bedtime., Disp: 1 kit, Rfl: 1   Continuous Glucose Receiver (DEXCOM G7 RECEIVER) DEVI, Use as directed for blood sugar monitoring., Disp: 1 each, Rfl: 0   Continuous Glucose Sensor (DEXCOM G7 SENSOR) MISC, Use as directed for blood sugar monitoring., Disp: 3 each, Rfl: 11   glipiZIDE  (GLUCOTROL ) 5 MG tablet, Take 0.5 tablets (2.5 mg total) by mouth daily with breakfast., Disp: 30 tablet,  Rfl: 1   glucose blood (ACCU-CHEK GUIDE) test strip, Check blood sugar 3 times per day, Disp: 100 each, Rfl: 12   insulin  glargine-yfgn (SEMGLEE , YFGN,) 100 UNIT/ML Pen, INJECT 40 UNITS INTO THE SKIN ONCE EVERY DAY, Disp: 15 mL, Rfl: 3   Insulin  Pen Needle 32G X 4 MM MISC, USE AS DIRECTED TO INJECT INSULIN  FOUR TIMES DAILY., Disp: 100 each, Rfl: 3   lisinopril  (ZESTRIL ) 40 MG tablet, Take 1 tablet (40 mg total) by mouth daily., Disp: 30 tablet, Rfl: 3   metFORMIN  (GLUCOPHAGE ) 1000 MG tablet, TAKE 1 TABLET BY MOUTH TWICE A DAY WITH A MEAL, Disp: 60 tablet, Rfl: 11   Semaglutide , 1 MG/DOSE, (OZEMPIC , 1 MG/DOSE,) 4 MG/3ML SOPN, INJECT 1MG  INTO THE SKIN ONCE A WEEK, Disp: 3 mL, Rfl: 2  Review of Systems:    Negative except for what is stated in HPI  Physical Exam:  Vitals:   12/07/23 1431  BP: 133/86  Pulse: 97  SpO2: 98%  Weight: (!) 354 lb 12.8 oz (160.9 kg)  Height: 5' 7 (1.702 m)    General: Patient is sitting comfortably in the room  Cardio: Regular rate and rhythm, no murmurs, rubs or gallops Pulmonary: Clear to ausculation bilaterally with no rales, rhonchi, and crackles   Assessment & Plan:   Primary Hypertension Patient has a past medical history of hypertension.  Current medications include amlodipine  5 mg daily and lisinopril  40 mg daily.  He states he is  compliant with his medications.  Blood pressure today is 133/86.  Slightly above what I would like, but is still coming down to normal.  He denies any chest pain, headaches, vision changes, shortness of breath.  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  He also has complications with diabetic nephropathy.  His A1c today is 9.0 which is the same as 4 months ago.  He does report adherence to his medications.  Did have conversation  with patient about importance of good glycemic control as he is already showing signs of diabetic nephropathy.  Did discuss potential options with patient. One includes mealtime insulin , and another includes starting an SGLT2 inhibitor.  He states that he would like more information about an SGLT2.  He will then decide if he would like to start either SGLT2 or not.  Patient has been on glipizide , but since he has been bridged now we can stop glipizide .  Patient deferred foot exam today.  Will refer patient to ophthalmology.  He does state that his fasting sugars are around the 160s-170s therefore do not think it would be beneficial to continue to increase long acting insulin .  Plan: -Foot exam at next visit -Continue Ozempic  1 mg weekly -Discontinue glipizide  -Follow-up in 3 months -Patient referred to ophthalmology -Continue Semglee  40 units nightly -Continue metformin  1000 mg twice daily -Information provided about SGLT2, patient to call if he was to start -Patient states he will call nutritionist [name]) for appointment  Patient discussed with Dr. Forest Libby Blanch, DO PGY-2 Internal Medicine Resident  Pager: [phone]

## 2023-12-07 NOTE — Patient Instructions (Addendum)
 Scott Logan,Thank you for allowing me to take part in your care today.  Here are your instructions.  1.  Regarding your diabetes, this is still uncontrolled.  Stop taking your glipizide  at this time.  Continue taking all of your medications as prescribed.  I have sent you home with some information.  Please read this information and if you want to start Jardiance please let me know.  Please come back in 3 months and we can schedule an A1c at that time.  2.  Your blood pressure is within normal limits.  At next visit we can check your kidney function.  Please continue taking all of your other medications.  3.  At anytime if you feel like you want to start the Jardiance please call me.  Thank you, Dr. Tobie  If you have any other questions please contact the internal medicine clinic at [phone] If it is after hours, please call the [facility] at [phone] and then ask the person who picks up for the resident on call.

## 2023-12-07 NOTE — Assessment & Plan Note (Signed)
 Patient has a past medical history of hypertension.  Current medications include amlodipine  5 mg daily and lisinopril  40 mg daily.  He states he is compliant with his medications.  Blood pressure today is 133/86.  Slightly above what I would like, but is still coming down to normal.  He denies any chest pain, headaches, vision changes, shortness of breath.  Plan: -Continue lisinopril  40 mg daily -Continue amlodipine  5 mg daily -Continue to monitor blood pressure closely

## 2023-12-07 NOTE — Assessment & Plan Note (Addendum)
 Patient has a past medical history of type 2 diabetes mellitus.  He also has complications with diabetic nephropathy.  His A1c today is 9.0 which is the same as 4 months ago.  He does report adherence to his medications.  Did have conversation with patient about importance of good glycemic control as he is already showing signs of diabetic nephropathy.  Did discuss potential options with patient. One includes mealtime insulin , and another includes starting an SGLT2 inhibitor.  He states that he would like more information about an SGLT2.  He will then decide if he would like to start either SGLT2 or not.  Patient has been on glipizide , but since he has been bridged now we can stop glipizide .  Patient deferred foot exam today.  Will refer patient to ophthalmology.  He does state that his fasting sugars are around the 160s-170s therefore do not think it would be beneficial to continue to increase long acting insulin .  Plan: -Foot exam at next visit -Continue Ozempic  1 mg weekly -Discontinue glipizide  -Follow-up in 3 months -Patient referred to ophthalmology -Continue Semglee  40 units nightly -Continue metformin  1000 mg twice daily -Information provided about SGLT2, patient to call if he was to start -Patient states he will call nutritionist [name]) for appointment

## 2023-12-09 NOTE — Progress Notes (Signed)
 Internal Medicine Clinic Attending  Case discussed with the resident at the time of the visit.  We reviewed the resident's history and exam and pertinent patient test results.  I agree with the assessment, diagnosis, and plan of care documented in the resident's note.

## 2024-01-11 ENCOUNTER — Telehealth: Payer: Self-pay

## 2024-01-11 NOTE — Telephone Encounter (Signed)
 Prior Authorization for patient (Dexcom G7 Sensor) came through on cover my meds was submitted with last office notes and labs awaiting approval or denial.  ZOX:[id number]

## 2024-01-11 NOTE — Telephone Encounter (Signed)
 [facility] [place] ([employer]) PA Case ID #: [id number] Need Help? Call us at [phone] Outcome Approved today by [facility] PA Case: [id number], Status: Approved, Coverage Starts on: 01/11/2024 12:00:00 AM, Coverage Ends on: 07/09/2024 12:00:00 AM. Authorization Expiration Date: 07/08/2024 Drug Dexcom G7 Sensor ePA cloud logo Form [employer] [facility] [place] [employer] [place] Form [phone] NCPDP)

## 2024-02-03 ENCOUNTER — Telehealth: Payer: Self-pay

## 2024-02-03 NOTE — Telephone Encounter (Signed)
 Prior Authorization for patient (Ozempic (0.25 or 0.5 MG/DOSE) 2MG /3ML pen-injectors) came through on cover my meds was submitted per cover my meds.Marland Kitchen  WUJ:[id number]   Scott Logan (Key: [id number]) Ozempic (0.25 or 0.5 MG/DOSE) 2MG /3ML pen-injectors Form [facility] [employer] Electronic [place] Form [phone] NCPDP) Created Message from Plan Available without authorization.

## 2024-02-12 ENCOUNTER — Other Ambulatory Visit: Payer: Self-pay | Admitting: Internal Medicine

## 2024-02-12 DIAGNOSIS — E119 Type 2 diabetes mellitus without complications: Secondary | ICD-10-CM

## 2024-02-29 ENCOUNTER — Other Ambulatory Visit: Payer: Self-pay | Admitting: Internal Medicine

## 2024-02-29 DIAGNOSIS — E119 Type 2 diabetes mellitus without complications: Secondary | ICD-10-CM

## 2024-03-01 NOTE — Telephone Encounter (Signed)
 Medication sent to pharmacy

## 2024-03-13 ENCOUNTER — Other Ambulatory Visit: Payer: Self-pay | Admitting: Internal Medicine

## 2024-03-13 DIAGNOSIS — I10 Essential (primary) hypertension: Secondary | ICD-10-CM

## 2024-04-02 ENCOUNTER — Other Ambulatory Visit: Payer: Self-pay | Admitting: Internal Medicine

## 2024-04-02 DIAGNOSIS — E119 Type 2 diabetes mellitus without complications: Secondary | ICD-10-CM

## 2024-04-03 NOTE — Telephone Encounter (Signed)
 Medication sent to pharmacy

## 2024-04-17 ENCOUNTER — Other Ambulatory Visit: Payer: Self-pay | Admitting: Internal Medicine

## 2024-04-17 DIAGNOSIS — E119 Type 2 diabetes mellitus without complications: Secondary | ICD-10-CM

## 2024-04-18 ENCOUNTER — Other Ambulatory Visit: Payer: Self-pay | Admitting: Internal Medicine

## 2024-04-18 DIAGNOSIS — E119 Type 2 diabetes mellitus without complications: Secondary | ICD-10-CM

## 2024-04-18 NOTE — Telephone Encounter (Signed)
 Medication sent to pharmacy

## 2024-05-04 ENCOUNTER — Encounter: Admitting: Internal Medicine

## 2024-05-04 NOTE — Progress Notes (Deleted)
 CC: ***  HPI:  Scott Logan is a 28 y.o. male living with a history stated below and presents today for ***. Please see problem based assessment and plan for additional details.  Past Medical History:  Diagnosis Date   Acute otitis media 10/14/2021   Asthma    Diabetes (HCC)    Hypertension    Necrotizing fasciitis of left buttock (HCC) 07/04/2020   Admitted on 8/12 for necrotizing fasciitis of perineum extending into base of scrotum, underwent debridement on 8/12, taken back to OR for possible debridement but no necrotic areas wound and just washed out. Wound culture grew MSSA, discharged on 8/17 with PO Keflex  for 10 days total, also nystatin  for yeast noted around wound.      Rash and nonspecific skin eruption 06/02/2022    Current Outpatient Medications on File Prior to Visit  Medication Sig Dispense Refill   metFORMIN  (GLUCOPHAGE ) 1000 MG tablet TAKE 1 TABLET BY MOUTH TWICE A DAY WITH FOOD 180 tablet 3   ACCU-CHEK GUIDE TEST test strip CHECK BLOOD SUGAR 3 TIMES A DAY 100 strip 12   Accu-Chek Softclix Lancets lancets Check blood sugar up to 3 times daily as directed 100 each 1   amLODipine  (NORVASC ) 5 MG tablet Take 1 tablet (5 mg total) by mouth daily. 30 tablet 11   BD PEN NEEDLE NANO 2ND GEN 32G X 4 MM MISC USE AS DIRECTED TO INJECT INSULIN  FOUR TIMES DAILY. 100 each 3   blood glucose meter kit and supplies KIT Dispense based on patient and insurance preference. Use up to four times daily as directed. (FOR ICD-9 250.00, 250.01). 1 each 0   Blood Glucose Monitoring Suppl (ACCU-CHEK GUIDE) w/Device KIT 1 each by Does not apply route in the morning, at noon, and at bedtime. 1 kit 1   Continuous Glucose Receiver (DEXCOM G7 RECEIVER) DEVI Use as directed for blood sugar monitoring. 1 each 0   Continuous Glucose Sensor (DEXCOM G7 SENSOR) MISC Use as directed for blood sugar monitoring. 3 each 11   glipiZIDE  (GLUCOTROL ) 5 MG tablet Take 0.5 tablets (2.5 mg total) by mouth  daily with breakfast. 30 tablet 1   insulin  glargine-yfgn (SEMGLEE , YFGN,) 100 UNIT/ML Pen INJECT 40 UNITS INTO THE SKIN ONCE EVERY DAY 15 mL 3   lisinopril  (ZESTRIL ) 40 MG tablet TAKE 1 TABLET BY MOUTH EVERY DAY 30 tablet 3   Semaglutide , 1 MG/DOSE, (OZEMPIC , 1 MG/DOSE,) 4 MG/3ML SOPN INJECT 1 MG INTO THE SKIN ONE TIME PER WEEK 3 mL 2   No current facility-administered medications on file prior to visit.    Family History  Problem Relation Age of Onset   Kidney Stones Mother    Diabetes Sister    Diabetes Brother        prediabetes    Social History   Socioeconomic History   Marital status: Single    Spouse name: Not on file   Number of children: Not on file   Years of education: Not on file   Highest education level: Not on file  Occupational History   Occupation: Photographer  Tobacco Use   Smoking status: Never   Smokeless tobacco: Never  Vaping Use   Vaping status: Never Used  Substance and Sexual Activity   Alcohol use: Never   Drug use: Never   Sexual activity: Yes    Birth control/protection: Condom, Coitus interruptus  Other Topics Concern   Not on file  Social History Narrative   Caffiene: occasional soda (  zero )   Working: self employed. Videographer.   Social Drivers of Corporate investment banker Strain: Not on file  Food Insecurity: No Food Insecurity (02/03/2023)   Hunger Vital Sign    Worried About Running Out of Food in the Last Year: Never true    Ran Out of Food in the Last Year: Never true  Transportation Needs: No Transportation Needs (02/03/2023)   PRAPARE - Administrator, Civil Service (Medical): No    Lack of Transportation (Non-Medical): No  Physical Activity: Not on file  Stress: Not on file  Social Connections: Moderately Isolated (02/03/2023)   Social Connection and Isolation Panel    Frequency of Communication with Friends and Family: More than three times a week    Frequency of Social Gatherings with Friends and Family:  More than three times a week    Attends Religious Services: Never    Database administrator or Organizations: No    Attends Banker Meetings: Never    Marital Status: Living with partner  Intimate Partner Violence: Not At Risk (02/03/2023)   Humiliation, Afraid, Rape, and Kick questionnaire    Fear of Current or Ex-Partner: No    Emotionally Abused: No    Physically Abused: No    Sexually Abused: No    Review of Systems: ROS negative except for what is noted on the assessment and plan.  There were no vitals filed for this visit.  Physical Exam: Constitutional: well-appearing *** sitting in ***, in no acute distress HENT: normocephalic atraumatic, mucous membranes moist Eyes: conjunctiva non-erythematous Cardiovascular: regular rate and rhythm, no m/r/g Pulmonary/Chest: normal work of breathing on room air, lungs clear to auscultation bilaterally Abdominal: soft, non-tender, non-distended MSK: normal bulk and tone Neurological: alert & oriented x 3, no focal deficit Skin: warm and dry Psych: normal mood and behavior  Assessment & Plan:   DM: A1c 9 > - ozempic  1 mg/week > inc - semglee  40u at bedtime - metformin  1 g bid  - glipizide ? - last office visit said consider sglt2i but he has nx of nec fasc  - ophtho? Last eye exam feb 2024 with evidence of diabetic retinopathy  - foot exam  HTN: - lisinopril  40 - amlodipine  5  Patient {GC/GE:[id number]::discussed with,seen with} Dr. {WUJWJ:[id number]::NFAOZHYQ,M. Hoffman,Mullen,Narendra,Vincent,Guilloud,Lau,Machen}  No problem-specific Assessment & Plan notes found for this encounter.   Zenith Kercheval, D.O. [facility] Internal Medicine, PGY-3 Phone: [phone] Date 05/04/2024 Time 7:45 AM

## 2024-05-15 ENCOUNTER — Other Ambulatory Visit: Payer: Self-pay | Admitting: Internal Medicine

## 2024-05-15 DIAGNOSIS — E119 Type 2 diabetes mellitus without complications: Secondary | ICD-10-CM

## 2024-05-16 MED ORDER — DEXCOM G7 SENSOR MISC: 11 refills | Status: AC

## 2024-06-05 ENCOUNTER — Encounter: Payer: Self-pay | Admitting: Adult Health

## 2024-06-05 ENCOUNTER — Ambulatory Visit: Payer: Medicaid Other | Admitting: Adult Health

## 2024-06-13 ENCOUNTER — Telehealth: Payer: Self-pay

## 2024-06-13 NOTE — Telephone Encounter (Signed)
 Prior Authorization for patient (Dexcom G7 Sensor) came through on cover my meds was submitted with last office notes and labs awaiting approval or denial.  KEY:[id number]

## 2024-06-13 NOTE — Telephone Encounter (Signed)
 [facility] reviewed your [facility] G7 SENSOR request for the above-identified member,  and it is denied for the following reason: because we did not see what we need to approve the  device you asked for, (Dexcom G7 sensor). We may be able to approve this device when we  see certain records (records that you have had a face-to-face meeting with the ordering  practitioner no more than 3 months prior to submission of the reauthorization request to see  how well this device is working for you [facility] the efficacy of the continuous glucose  monitoring system]; records that you have been able to maintain or further improve blood  sugar [glycemic] control or records that you continue to use a device called an external insulin   pump). We based this decision on your health plan's prior authorization clinical criteria named  Therapeutic Continuous Glucose Monitoring Systems (CGM) and Related Supplies.  Patient has been scheduled to come in 7/29 at 10:15 to see Dr.Koomson. The PA can be resubmitted whenever he comes to his appointment.

## 2024-06-20 ENCOUNTER — Encounter: Admitting: Student

## 2024-06-21 ENCOUNTER — Telehealth: Payer: Self-pay

## 2024-06-21 ENCOUNTER — Ambulatory Visit: Admitting: Student

## 2024-06-21 ENCOUNTER — Encounter: Payer: Self-pay | Admitting: Student

## 2024-06-21 VITALS — BP 130/79 | HR 80 | Temp 98.1°F | Ht 67.0 in | Wt 356.8 lb

## 2024-06-21 DIAGNOSIS — G4733 Obstructive sleep apnea (adult) (pediatric): Secondary | ICD-10-CM | POA: Diagnosis not present

## 2024-06-21 DIAGNOSIS — I10 Essential (primary) hypertension: Secondary | ICD-10-CM | POA: Diagnosis not present

## 2024-06-21 DIAGNOSIS — E119 Type 2 diabetes mellitus without complications: Secondary | ICD-10-CM | POA: Diagnosis present

## 2024-06-21 DIAGNOSIS — Z7985 Long-term (current) use of injectable non-insulin antidiabetic drugs: Secondary | ICD-10-CM | POA: Diagnosis not present

## 2024-06-21 DIAGNOSIS — Z6841 Body Mass Index (BMI) 40.0 and over, adult: Secondary | ICD-10-CM | POA: Diagnosis not present

## 2024-06-21 LAB — POCT GLYCOSYLATED HEMOGLOBIN (HGB A1C): Hemoglobin A1C: 9.1 % — AB (ref 4.0–5.6)

## 2024-06-21 LAB — GLUCOSE, CAPILLARY: Glucose-Capillary: 291 mg/dL — ABNORMAL HIGH (ref 70–99)

## 2024-06-21 MED ORDER — OZEMPIC (2 MG/DOSE) 8 MG/3ML ~~LOC~~ SOPN: 2.0000 mg | PEN_INJECTOR | SUBCUTANEOUS | 3 refills | Status: DC

## 2024-06-21 NOTE — Patient Instructions (Addendum)
   Return in about 2 weeks (around 07/05/2024) for diabetes follow-up.  Increase your Ozempic  to 2 mg weekly.  Remember to bring all of the medications that you take (including over the counter medications and supplements) with you to every clinic visit.  This after visit summary is an important review of tests, referrals, and medication changes that were discussed during your visit. If you have questions or concerns, call [phone]. Outside of clinic business hours, call the main hospital at [phone] and ask the operator for the on-call internal medicine resident.   Ozell Kung MD 06/21/2024, 2:38 PM

## 2024-06-21 NOTE — Assessment & Plan Note (Signed)
 Body mass index is 55.88 kg/m.  I recommend eliminating sugary beverages like soda, sweet tea, and juice entirely. I recommended more vegetables, lean protein, and legumes. Frozen vegetables are healthy and inexpensive. Beans are a healthy and inexpensive source of lean protein and fiber. I recommend gradually increasing exercise. A daily walk is a great way to start an exercise program.  Referral: to dietitian, Arland Plyler  Pharmacological intervention: increase GLP-1 receptor agonist dose

## 2024-06-21 NOTE — Assessment & Plan Note (Signed)
 Encouraged use of CPAP machine at home.

## 2024-06-21 NOTE — Progress Notes (Signed)
 Patient name: Scott Logan Date of birth: 01/29/1996 Date of visit: 06/21/24  Subjective   Chief concern: diabetes check up  He's overdue for follow-up. A1c at last visit was 9. He doesn't check his blood sugar routinely. He's adherent to his glargine, metformin , and ozempic .  Review of Systems  Constitutional:  Negative for weight loss (weight gain).    Current Outpatient Medications  Medication Instructions   ACCU-CHEK GUIDE TEST test strip CHECK BLOOD SUGAR 3 TIMES A DAY   Accu-Chek Softclix Lancets lancets Check blood sugar up to 3 times daily as directed   amLODipine  (NORVASC ) 5 mg, Oral, Daily   BD PEN NEEDLE NANO 2ND GEN 32G X 4 MM MISC USE AS DIRECTED TO INJECT INSULIN  FOUR TIMES DAILY.   blood glucose meter kit and supplies KIT Dispense based on patient and insurance preference. Use up to four times daily as directed. (FOR ICD-9 250.00, 250.01).   Blood Glucose Monitoring Suppl (ACCU-CHEK GUIDE) w/Device KIT 1 each, Does not apply, 3 times daily   Continuous Glucose Receiver (DEXCOM G7 RECEIVER) DEVI Use as directed for blood sugar monitoring.   Continuous Glucose Sensor (DEXCOM G7 SENSOR) MISC Use as directed for blood sugar monitoring.   glipiZIDE  (GLUCOTROL ) 2.5 mg, Oral, Daily with breakfast   insulin  glargine-yfgn (SEMGLEE , YFGN,) 100 UNIT/ML Pen INJECT 40 UNITS INTO THE SKIN ONCE EVERY DAY   lisinopril  (ZESTRIL ) 40 mg, Oral, Daily   metFORMIN  (GLUCOPHAGE ) 1,000 mg, Oral, 2 times daily with meals   Semaglutide , 1 MG/DOSE, (OZEMPIC , 1 MG/DOSE,) 4 MG/3ML SOPN INJECT 1 MG INTO THE SKIN ONE TIME PER WEEK     Objective  Today's Vitals   06/21/24 1403 06/21/24 1454  BP: (!) 151/90 130/79  Pulse: 85 80  Temp: 98.1 F (36.7 C)   TempSrc: Oral   SpO2: 95%   Weight: (!) 356 lb 12.8 oz (161.8 kg)   Height: 5' 7 (1.702 m)   Body mass index is 55.88 kg/m.   Physical Exam Constitutional:      Appearance: Normal appearance.  Cardiovascular:     Rate and  Rhythm: Normal rate and regular rhythm.  Pulmonary:     Effort: Pulmonary effort is normal. No respiratory distress.  Skin:    General: Skin is warm and dry.  Neurological:     Mental Status: He is alert.     Cranial Nerves: No facial asymmetry.  Psychiatric:        Mood and Affect: Affect normal.        Speech: Speech normal.        Behavior: Behavior normal.    Diabetic Foot Exam - Simple   Simple Foot Form Diabetic Foot exam was performed with the following findings: Yes 06/21/2024  5:33 PM  Visual Inspection No deformities, no ulcerations, no other skin breakdown bilaterally: Yes Sensation Testing Intact to touch and monofilament testing bilaterally: Yes Pulse Check See comments: Yes Comments DP pulses intact bilaterally       Assessment & Plan   Type 2 diabetes mellitus without complication, unspecified whether long term insulin  use (HCC) Assessment & Plan: Poorly controlled with significant proteinuria. A1c remains stable. No blood sugar data to present today. Increasing GLP-1. Recommend wearing his CGM and returning in 2 weeks for follow-up and further medication management.  Orders: -     POCT glycosylated hemoglobin (Hb A1C) -     Glucose, capillary -     Ambulatory referral to Ophthalmology -     Referral to  Nutrition and Diabetes Services -     Basic metabolic panel with GFR -     Microalbumin / creatinine urine ratio -     Ozempic  (2 MG/DOSE); Inject 2 mg into the skin once a week.  Dispense: 3 mL; Refill: 3  OSA (obstructive sleep apnea) Assessment & Plan: Encouraged use of CPAP machine at home.   Morbid obesity (HCC) Assessment & Plan: Body mass index is 55.88 kg/m.  I recommend eliminating sugary beverages like soda, sweet tea, and juice entirely. I recommended more vegetables, lean protein, and legumes. Frozen vegetables are healthy and inexpensive. Beans are a healthy and inexpensive source of lean protein and fiber. I recommend gradually  increasing exercise. A daily walk is a great way to start an exercise program.  Referral: to dietitian, Arland Plyler  Pharmacological intervention: increase GLP-1 receptor agonist dose   Orders: -     Ozempic  (2 MG/DOSE); Inject 2 mg into the skin once a week.  Dispense: 3 mL; Refill: 3  Primary hypertension Assessment & Plan: 130/79 on amlodipine  5 mg daily and lisinopril  40 mg daily. Appropriate to continue these medications today.  Orders: -     Basic metabolic panel with GFR    Return in about 2 weeks (around 07/05/2024) for diabetes follow-up.  Ozell Kung MD 06/21/2024, 5:36 PM

## 2024-06-21 NOTE — Telephone Encounter (Signed)
 Prior Authorization for patient (Dexcom G7 Sensor) came through on cover my meds was submitted with last office notes and labs awaiting approval or denial.  [id number]:

## 2024-06-21 NOTE — Assessment & Plan Note (Addendum)
 Poorly controlled with significant proteinuria. A1c remains stable. No blood sugar data to present today. Increasing GLP-1. Recommend wearing his CGM and returning in 2 weeks for follow-up and further medication management.

## 2024-06-21 NOTE — Assessment & Plan Note (Addendum)
 130/79 on amlodipine  5 mg daily and lisinopril  40 mg daily. Appropriate to continue these medications today.

## 2024-06-22 NOTE — Telephone Encounter (Addendum)
 Date: 06/22/2024 Request #: [id number] Physician name: Ozell Nearing Office fax: [phone] Member name: Saheed Carrington Member ID: [id number] Member DOB: 06/14/1996 Drug requested: OTHELIA PANTHER SENSOR Notes: This is a courtesy notification to advise you that we do not cover the above-requested  medication under this member's benefit plan.    Lvm for the patient regarding the denial.

## 2024-06-22 NOTE — Progress Notes (Signed)
 Internal Medicine Clinic Attending  Case discussed with the resident at the time of the visit.  We reviewed the resident's history and exam and pertinent patient test results.  I agree with the assessment, diagnosis, and plan of care documented in the resident's note.

## 2024-07-27 ENCOUNTER — Telehealth: Payer: Self-pay

## 2024-07-27 ENCOUNTER — Other Ambulatory Visit (HOSPITAL_COMMUNITY): Payer: Self-pay

## 2024-07-27 NOTE — Telephone Encounter (Signed)
 Pharmacy Patient Advocate Encounter  Received notification from [employer] that Prior Authorization for [facility] G7 EVALINE has been APPROVED from 07/27/24 to 01/23/25   PA #/Case ID/Reference #: [id number]

## 2024-07-27 NOTE — Telephone Encounter (Signed)
 Prior authorization submitted for DEXCOM G7 SENSORS to [employer] via Latent.   Key: [id number]

## 2024-07-31 ENCOUNTER — Telehealth: Payer: Self-pay | Admitting: *Deleted

## 2024-07-31 ENCOUNTER — Encounter: Admitting: Dietician

## 2024-07-31 ENCOUNTER — Other Ambulatory Visit: Payer: Self-pay | Admitting: Student

## 2024-07-31 ENCOUNTER — Telehealth: Payer: Self-pay | Admitting: Dietician

## 2024-07-31 DIAGNOSIS — I10 Essential (primary) hypertension: Secondary | ICD-10-CM

## 2024-07-31 DIAGNOSIS — E119 Type 2 diabetes mellitus without complications: Secondary | ICD-10-CM

## 2024-07-31 MED ORDER — METFORMIN HCL 1000 MG PO TABS: 1000.0000 mg | ORAL_TABLET | Freq: Two times a day (BID) | ORAL | 3 refills | Status: AC

## 2024-07-31 MED ORDER — OZEMPIC (2 MG/DOSE) 8 MG/3ML ~~LOC~~ SOPN: 2.0000 mg | PEN_INJECTOR | SUBCUTANEOUS | 3 refills | Status: AC

## 2024-07-31 MED ORDER — AMLODIPINE BESYLATE 5 MG PO TABS: 5.0000 mg | ORAL_TABLET | Freq: Every day | ORAL | 11 refills | Status: DC

## 2024-07-31 MED ORDER — LISINOPRIL 40 MG PO TABS: 40.0000 mg | ORAL_TABLET | Freq: Every day | ORAL | 3 refills | Status: DC

## 2024-07-31 MED ORDER — INSULIN GLARGINE-YFGN 100 UNIT/ML ~~LOC~~ SOPN: 40.0000 [IU] | PEN_INJECTOR | Freq: Every day | SUBCUTANEOUS | 3 refills | Status: DC

## 2024-07-31 NOTE — Telephone Encounter (Signed)
 Patient was called and refill request was sent.   Copied from CRM [id number]. Topic: Clinical - Prescription Issue >> Jul 31, 2024 10:26 AM Cherylann RAMAN wrote: Reason for CRM: Patient states that he went to the pharmacy to pick up his medication, however, they stated that the provider has not registered his insurance and the patient is not able to get the remainder of his medication such as his insulin . Patient is out of his insulin . Please contact patient [phone]. >> Jul 31, 2024 12:44 PM Miquel SAILOR wrote: Patient calling on update for medication. Needs call back [phone]

## 2024-07-31 NOTE — Telephone Encounter (Signed)
 Copied from CRM [id number]. Topic: Clinical - Prescription Issue >> Jul 31, 2024 10:26 AM Cherylann RAMAN wrote: Reason for CRM: Patient states that he went to the pharmacy to pick up his medication, however, they stated that the provider has not registered his insurance and the patient is not able to get the remainder of his medication such as his insulin . Patient is out of his insulin . Please contact patient [phone]. >> Jul 31, 2024 12:44 PM Miquel SAILOR wrote: Patient calling on update for medication. Needs call back [phone]

## 2024-07-31 NOTE — Telephone Encounter (Signed)
 Patient is requesting all of medications to be refilled asap, patient stated that he hasn't had his insulin  for about 2 days. Patient is not able to get his rx ion file placed by Dr.Atway. The patient has medicaid the refills needs to be ordered by Amoako,Juberg,Patel or Koomson.  [id number] - [place], [place] - [address] AT [address] OF [address]

## 2024-07-31 NOTE — Telephone Encounter (Signed)
 Call to Pharmacy prescription was written by a physician not covered by Medicaid.  New prescription needs o be done and sent to the Pharmacy for the patients Semglee  Insulin .  Patient to discuss other medications needed at his visit today.   Copied from CRM [id number]. Topic: Clinical - Prescription Issue >> Jul 31, 2024 10:26 AM Cherylann RAMAN wrote: Reason for CRM: Patient states that he went to the pharmacy to pick up his medication, however, they stated that the provider has not registered his insurance and the patient is not able to get the remainder of his medication such as his insulin . Patient is out of his insulin . Please contact patient [phone].

## 2024-07-31 NOTE — Telephone Encounter (Signed)
 Appointment rescheduled. Copied from CRM [id number]. Topic: Appointments - Appointment Cancel/Reschedule >> Jul 31, 2024 12:45 PM Miquel SAILOR wrote: Patient/patient representative is calling to cancel or reschedule an appointment. Refer to attachments for appointment information.   Patient calling to reschedule app 09/08 Dr. Reubin. Called transferred to office

## 2024-07-31 NOTE — Telephone Encounter (Signed)
 Call to patient to reschedule today's cancelled appointment . He agreed to meet with Diabetes educator on 9/24 at 315 PM.  Patient states he has picked up the Dexcom G7 CGM sensors but is not wearing it. He said he had problems before allowing remote uploading. We agreed to work on that when he is here on 08/16/24.he also states his blood sugars are improved on the increased dose of Ozempic  and metformin . He is tolerating them with no ill side effects. He is waiting for his insulin  to be refilled.

## 2024-07-31 NOTE — Progress Notes (Signed)
 Called pt as he is out of insulin . Will provide refill of semglee  40 u nightly as well as refills of semaglutide  2 weekly, metformin  1000 BID, lisinopril  40 qd, amlodipine  5 qd.

## 2024-08-16 ENCOUNTER — Ambulatory Visit: Payer: Self-pay | Admitting: Dietician

## 2024-08-16 ENCOUNTER — Encounter: Payer: Self-pay | Admitting: Student

## 2024-08-16 ENCOUNTER — Ambulatory Visit: Admitting: Student

## 2024-08-16 VITALS — BP 140/85 | HR 101 | Temp 98.1°F | Ht 67.0 in | Wt 350.6 lb

## 2024-08-16 DIAGNOSIS — E119 Type 2 diabetes mellitus without complications: Secondary | ICD-10-CM

## 2024-08-16 DIAGNOSIS — N181 Chronic kidney disease, stage 1: Secondary | ICD-10-CM

## 2024-08-16 DIAGNOSIS — I10 Essential (primary) hypertension: Secondary | ICD-10-CM

## 2024-08-16 DIAGNOSIS — Z7985 Long-term (current) use of injectable non-insulin antidiabetic drugs: Secondary | ICD-10-CM

## 2024-08-16 MED ORDER — AMLODIPINE BESYLATE 10 MG PO TABS: 10.0000 mg | ORAL_TABLET | Freq: Every day | ORAL | 11 refills | Status: AC

## 2024-08-16 MED ORDER — ACCU-CHEK SOFTCLIX LANCETS MISC: 1 refills | Status: AC

## 2024-08-16 MED ORDER — ACCU-CHEK GUIDE TEST VI STRP: ORAL_STRIP | 12 refills | Status: AC

## 2024-08-16 MED ORDER — INSULIN GLARGINE-YFGN 100 UNIT/ML ~~LOC~~ SOPN: 45.0000 [IU] | PEN_INJECTOR | Freq: Every day | SUBCUTANEOUS | 3 refills | Status: AC

## 2024-08-16 MED ORDER — BD PEN NEEDLE NANO 2ND GEN 32G X 4 MM MISC: 3 refills | Status: AC

## 2024-08-16 MED ORDER — LISINOPRIL 40 MG PO TABS: 40.0000 mg | ORAL_TABLET | Freq: Every day | ORAL | 11 refills | Status: AC

## 2024-08-16 MED ORDER — ACCU-CHEK GUIDE W/DEVICE KIT: 1.0000 | PACK | Freq: Three times a day (TID) | 1 refills | Status: AC

## 2024-08-16 NOTE — Progress Notes (Signed)
 Lab Results  Component Value Date   HGBA1C 9.1 (A) 06/21/2024   HGBA1C 9.0 (A) 12/07/2023   HGBA1C 9.0 (A) 08/31/2023   HGBA1C 8.1 (A) 05/10/2023   HGBA1C 8.2 (A) 02/03/2023    Wt Readings from Last 10 Encounters:  08/16/24 (!) 350 lb 9.6 oz (159 kg)  06/21/24 (!) 356 lb 12.8 oz (161.8 kg)  12/07/23 (!) 354 lb 12.8 oz (160.9 kg)  09/09/23 (!) 357 lb 9.6 oz (162.2 kg)  08/31/23 (!) 354 lb 3.2 oz (160.7 kg)  05/31/23 (!) 349 lb (158.3 kg)  05/10/23 (!) 348 lb 11.2 oz (158.2 kg)  02/03/23 (!) 347 lb 1.6 oz (157.4 kg)  01/13/23 (!) 339 lb 9.6 oz (154 kg)  12/08/22 (!) 322 lb 9.6 oz (146.3 kg)   BP Readings from Last 3 Encounters:  08/16/24 (!) 140/85  06/21/24 130/79  12/07/23 133/86   Diabetes Self-Management Education  Visit Type: First/Initial  Appt. Start Time: 315 Appt. End Time: 400  08/16/2024  Scott Logan, identified by name and date of birth, is a 28 y.o. male with a diagnosis of Diabetes: Type 2.   ASSESSMENT  There were no vitals taken for this visit. There is no height or weight on file to calculate BMI.   Diabetes Self-Management Education - 08/16/24 1600       Visit Information   Visit Type First/Initial      Initial Visit   Diabetes Type Type 2    Date Diagnosed 2021    Are you currently following a meal plan? No    Are you taking your medications as prescribed? Yes      Health Coping   How would you rate your overall health? Good      Psychosocial Assessment   Patient Belief/Attitude about Diabetes Motivated to manage diabetes    What is the hardest part about your diabetes right now, causing you the most concern, or is the most worrisome to you about your diabetes?   Making healty food and beverage choices;Being active    Self-care barriers Lack of material resources    Self-management support Doctor's office;CDE visits    Patient Concerns Nutrition/Meal planning;Glycemic Control;Monitoring    Special Needs None    Preferred Learning  Style No preference indicated    Learning Readiness Ready    How often do you need to have someone help you when you read instructions, pamphlets, or other written materials from your doctor or pharmacy? 2 - Rarely    What is the last grade level you completed in school? 12      Pre-Education Assessment   Patient understands the diabetes disease and treatment process. Needs Review    Patient understands incorporating nutritional management into lifestyle. Needs Review    Patient undertands incorporating physical activity into lifestyle. Needs Review    Patient understands using medications safely. Needs Review    Patient understands monitoring blood glucose, interpreting and using results Needs Review    Patient understands prevention, detection, and treatment of acute complications. Needs Review      Complications   Last HgB A1C per patient/outside source 9.1 %    How often do you check your blood sugar? --   just startede back on CGM today at officed visit- no data yet   Number of hypoglycemic episodes per month 0    Number of hyperglycemic episodes ( >200mg /dL): Daily    Can you tell when your blood sugar is high? Yes    What  do you do if your blood sugar is high? drinks water    Have you had a dilated eye exam in the past 12 months? No    Have you had a dental exam in the past 12 months? No    Are you checking your feet? No      Dietary Intake   Breakfast deferred      Activity / Exercise   Activity / Exercise Type --   deferred     Patient Education   Previous Diabetes Education Yes    Disease Pathophysiology Factors that contribute to the development of diabetes    Healthy Eating Plate Method;Carbohydrate counting;Reviewed blood glucose goals for pre and post meals and how to evaluate the patients' food intake on their blood glucose level.    Being Active Identified with patient nutritional and/or medication changes necessary with exercise.    Medications Reviewed patients  medication for diabetes, action, purpose, timing of dose and side effects.    Monitoring Taught/evaluated CGM (comment)    Acute complications Discussed and identified patients' prevention, symptoms, and treatment of hyperglycemia.      Individualized Goals (developed by patient)   Monitoring  Consistenly use CGM      Post-Education Assessment   Patient understands the diabetes disease and treatment process. Comprehends key points    Patient understands incorporating nutritional management into lifestyle. Comprehends key points    Patient undertands incorporating physical activity into lifestyle. Comprehends key points    Patient understands using medications safely. Comphrehends key points    Patient understands monitoring blood glucose, interpreting and using results Comprehends key points    Patient understands prevention, detection, and treatment of acute complications. Comprehends key points      Outcomes   Expected Outcomes Demonstrated interest in learning. Expect positive outcomes    Future DMSE 2 wks    Program Status Not Completed          Individualized Plan for Diabetes Self-Management Training:   Learning Objective:  Patient will have a greater understanding of diabetes self-management. Patient education plan is to attend individual and/or group sessions per assessed needs and concerns.   Plan:   There are no Patient Instructions on file for this visit.  Expected Outcomes:  Demonstrated interest in learning. Expect positive outcomes  Education material provided: Diabetes Resources  If problems or questions, patient to contact team via:  Phone and Email  Future DSME appointment: 2 wks Arland Hole, RD 08/16/2024 5:03 PM.

## 2024-08-16 NOTE — Assessment & Plan Note (Signed)
 Past medical history of CKD stage G1/A3.  Patient has had significantly elevated urine albumin creatinine ratio.  Has been on lisinopril .  Will check BMP today.  Will refer back to nephrology.  Will finerenone if BMP is stable and potassium looks stable.  Patient cannot use SGLT2 given history of necrotizing fasciitis.  Plan: - Follow-up BMP - Continue lisinopril  40 mg daily - Encourage patient to reach out back to his nephrologist - If BMP stable potassium levels, finerenone

## 2024-08-16 NOTE — Assessment & Plan Note (Addendum)
 Blood pressure is elevated in the 140s today.  On recheck 140/85.  Will increase amlodipine  to 10 mg daily.  Will have patient keep blood pressure log.  Goal to be less than 130/80.  Plan: - Increase amlodipine  to 10 mg daily - Continue lisinopril  40 mg daily - Return with blood pressure log, if still elevated, can start hydrochlorothiazide - Follow-up BMP

## 2024-08-16 NOTE — Progress Notes (Signed)
 CC: Blood pressure and diabetes follow-up  HPI:  [name] is a 28 y.o. male with a past medical history of hypertension, OSA, type 2 diabetes who presents for diabetes follow-up.  Please see assessment and plan for full HPI  Medications: Diabetes: Ozempic  2 mg weekly, Semglee  45 units daily, metformin  1000 mg twice daily Hypertension: Amlodipine  10 mg daily, lisinopril  40 mg daily   Past Medical History:  Diagnosis Date   Acute otitis media 10/14/2021   Asthma    Diabetes (HCC)    Hx of necrotizing fasciitis 08/31/2023   DO NOT USE SGLT-2 INHIBITORS, history of necrotizing fasciitis of the perineum.        Hypertension    Necrotizing fasciitis of left buttock (HCC) 07/04/2020   Admitted on 8/12 for necrotizing fasciitis of perineum extending into base of scrotum, underwent debridement on 8/12, taken back to OR for possible debridement but no necrotic areas wound and just washed out. Wound culture grew MSSA, discharged on 8/17 with PO Keflex  for 10 days total, also nystatin  for yeast noted around wound.      Rash and nonspecific skin eruption 06/02/2022     Current Outpatient Medications:    Accu-Chek Softclix Lancets lancets, Check blood sugar up to 3 times daily as directed, Disp: 100 each, Rfl: 1   amLODipine  (NORVASC ) 10 MG tablet, Take 1 tablet (10 mg total) by mouth daily., Disp: 30 tablet, Rfl: 11   blood glucose meter kit and supplies KIT, Dispense based on patient and insurance preference. Use up to four times daily as directed. (FOR ICD-9 250.00, 250.01)., Disp: 1 each, Rfl: 0   Blood Glucose Monitoring Suppl (ACCU-CHEK GUIDE) w/Device KIT, 1 each by Does not apply route in the morning, at noon, and at bedtime., Disp: 1 kit, Rfl: 1   Continuous Glucose Receiver (DEXCOM G7 RECEIVER) DEVI, Use as directed for blood sugar monitoring., Disp: 1 each, Rfl: 0   Continuous Glucose Sensor (DEXCOM G7 SENSOR) MISC, Use as directed for blood sugar monitoring., Disp: 3  each, Rfl: 11   glucose blood (ACCU-CHEK GUIDE TEST) test strip, Use as instructed, Disp: 100 strip, Rfl: 12   insulin  glargine-yfgn (SEMGLEE , YFGN,) 100 UNIT/ML Pen, Inject 45 Units into the skin daily., Disp: 15 mL, Rfl: 3   Insulin  Pen Needle (BD PEN NEEDLE NANO 2ND GEN) 32G X 4 MM MISC, Please use 1 needle daily for insulin  administration under the skin, Disp: 100 each, Rfl: 3   lisinopril  (ZESTRIL ) 40 MG tablet, Take 1 tablet (40 mg total) by mouth daily., Disp: 30 tablet, Rfl: 11   metFORMIN  (GLUCOPHAGE ) 1000 MG tablet, Take 1 tablet (1,000 mg total) by mouth 2 (two) times daily with a meal., Disp: 180 tablet, Rfl: 3   Semaglutide , 2 MG/DOSE, (OZEMPIC , 2 MG/DOSE,) 8 MG/3ML SOPN, Inject 2 mg into the skin once a week., Disp: 3 mL, Rfl: 3  Review of Systems:   Negative except for what is stated in HPI  Physical Exam:  Vitals:   08/16/24 1406 08/16/24 1457  BP: (!) 146/80 (!) 140/85  Pulse: 98 (!) 101  Temp: 98.1 F (36.7 C)   TempSrc: Oral   SpO2: 97%   Weight: (!) 350 lb 9.6 oz (159 kg)   Height: 5' 7 (1.702 m)     General: Patient is sitting comfortably in the room  Head: Normocephalic, atraumatic  Cardio: Regular rate and rhythm, no murmurs, rubs or gallops Pulmonary: Clear to ausculation bilaterally with no rales, rhonchi, and  crackles  Extremities: Bilateral lower extremities with sensation intact.  Monofilament test without decreased sensation.  2+ pedal pulses.  No obvious signs of ulceration.   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  Patient has had significantly elevated urine albumin creatinine ratio.  Has been on lisinopril .  Will check BMP today.  Will refer back to nephrology.  Will finerenone if BMP is stable and potassium looks stable.  Patient cannot use SGLT2 given history of necrotizing fasciitis.  Plan: - Follow-up BMP - Continue lisinopril  40 mg  daily - Encourage patient to reach out back to his nephrologist - If BMP stable potassium levels, finerenone  Type 2 diabetes mellitus without complication, unspecified whether long term insulin  use A1c on 07/30 was 9.1.  Not well-controlled.  Currently takes Semglee  40 units daily, Ozempic  2 mg weekly, and metformin  1000 mg twice daily.  Patient was due for ophthalmology exam, but did not make appointment.  Will give information today for make appointment.  Patient reports he has been checking his blood sugars at home and they range usually in the 200s.  Did have 1 of 130 fasting.  Foot exam updated today, no obvious concerns on lower extremities bilaterally.  Plan: - [place] to help place Dexcom Today - Continue metformin  1000 mg twice daily - Increase Semglee  to 45 units daily - Continue Ozempic  2 mg weekly - Follow-up in 1 month, titrate insulin  accordingly Primary hypertension Blood pressure is elevated in the 140s today.  On recheck 140/85.  Will increase amlodipine  to 10 mg daily.  Will have patient keep blood pressure log.  Goal to be less than 130/80.  Plan: - Increase amlodipine  to 10 mg daily - Continue lisinopril  40 mg daily - Return with blood pressure log, if still elevated, can start hydrochlorothiazide - Follow-up BMP  Patient discussed with Dr. Karna Libby Blanch, DO Internal Medicine Resident PGY-3

## 2024-08-16 NOTE — Patient Instructions (Addendum)
 Scott Logan,Thank you for allowing me to take part in your care today.  Here are your instructions.  1.  Increase your insulin  to 45 units daily  2.  Increase your amlodipine  to 10 mg daily.  Continue taking lisinopril  40 mg daily.  Keep blood pressure log.  3.  I am checking your kidney function today.  I will call you with the results.  Depending on the results, I might start a medication to protect your kidneys.  4.  Please come back in 1 month and at that time we can check your urine to see how much protein is spilling.  5.  Please call your ophthalmologist to schedule eye exam information below. Dr. Paticia Fairly Ophthalmologist in [place], [place]  Address: [address] [place], [place], [place] [address] Phone: [phone]  6.  Please call your kidney doctor. Dr. Gordy Blanch [facility] [address] [phone]   PLEASE BRING YOUR MEDICATIONS TO EVERY APPOINTMENT  Thank you, Dr. Blanch  If you have any other questions please contact the internal medicine clinic at [phone] If it is after hours, please call the [facility] at [phone] and then ask the person who picks up for the resident on call.

## 2024-08-16 NOTE — Assessment & Plan Note (Signed)
 A1c on 07/30 was 9.1.  Not well-controlled.  Currently takes Semglee  40 units daily, Ozempic  2 mg weekly, and metformin  1000 mg twice daily.  Patient was due for ophthalmology exam, but did not make appointment.  Will give information today for make appointment.  Patient reports he has been checking his blood sugars at home and they range usually in the 200s.  Did have 1 of 130 fasting.  Foot exam updated today, no obvious concerns on lower extremities bilaterally.  Plan: - [place] to help place Dexcom Today - Continue metformin  1000 mg twice daily - Increase Semglee  to 45 units daily - Continue Ozempic  2 mg weekly - Follow-up in 1 month, titrate insulin  accordingly

## 2024-08-17 ENCOUNTER — Ambulatory Visit: Payer: Self-pay | Admitting: Student

## 2024-08-17 LAB — BASIC METABOLIC PANEL WITH GFR
BUN/Creatinine Ratio: 20 (ref 9–20)
BUN: 19 mg/dL (ref 6–20)
CO2: 17 mmol/L — ABNORMAL LOW (ref 20–29)
Calcium: 9.2 mg/dL (ref 8.7–10.2)
Chloride: 105 mmol/L (ref 96–106)
Creatinine, Ser: 0.95 mg/dL (ref 0.76–1.27)
Glucose: 131 mg/dL — ABNORMAL HIGH (ref 70–99)
Potassium: 5 mmol/L (ref 3.5–5.2)
Sodium: 140 mmol/L (ref 134–144)
eGFR: 112 mL/min/1.73 (ref >59)

## 2024-08-17 NOTE — Progress Notes (Signed)
 Internal Medicine Clinic Attending  Case discussed with the resident at the time of the visit.  We reviewed the resident's history and exam and pertinent patient test results.  I agree with the assessment, diagnosis, and plan of care documented in the resident's note.

## 2024-12-20 NOTE — Addendum Note (Signed)
 Addended by: ANTONE DWAYNE SAILOR on: 12/20/2024 04:28 PM   Modules accepted: Orders

## 2024-12-27 ENCOUNTER — Telehealth: Payer: Self-pay

## 2024-12-27 NOTE — Telephone Encounter (Signed)
 Prior Authorization for patient (Dexcom G7 Sensor) came through on cover my meds was submitted with last office notes and labs awaiting approval or denial.  KEY:[id number]

## 2024-12-27 NOTE — Telephone Encounter (Signed)
 Regarding member: [id number]; Scott Logan; Jan 07, 1996 Dear [name]: [employer] reviewed your [facility] G7 SENSOR request for the above-identified member, and it is denied  for the following reason: because we did not see what we need to approve the device you asked for,  (Dexcom G7 sensor). We may be able to approve this device when we see certain records (records that  you have had a face-to-face meeting with the ordering practitioner no more than 3 months prior to  submission of the reauthorization request to see how well this device is working for you [facility] the  efficacy of the continuous glucose monitoring system]). We based this decision on your health plans  prior authorization clinical criteria named Therapeutic Continuous Glucose Monitoring Systems (CGM)  and Related Supplies.   Lvm for patient to schedule a appointment. Prior authorization can be resubmitted after he is seen in our clinic.
